# Patient Record
Sex: Female | Born: 1995 | Race: Black or African American | Hispanic: No | Marital: Single | Smoking: Never smoker
Health system: Southern US, Community
[De-identification: ages and names within clinical notes are randomized; demographics above are authoritative.]

## PROBLEM LIST (undated history)

## (undated) DIAGNOSIS — L309 Dermatitis, unspecified: Secondary | ICD-10-CM

## (undated) DIAGNOSIS — J45909 Unspecified asthma, uncomplicated: Secondary | ICD-10-CM

## (undated) DIAGNOSIS — T7840XA Allergy, unspecified, initial encounter: Secondary | ICD-10-CM

## (undated) DIAGNOSIS — Z889 Allergy status to unspecified drugs, medicaments and biological substances status: Secondary | ICD-10-CM

## (undated) DIAGNOSIS — Z91018 Allergy to other foods: Secondary | ICD-10-CM

## (undated) HISTORY — PX: WISDOM TOOTH EXTRACTION: SHX21

## (undated) HISTORY — DX: Allergy, unspecified, initial encounter: T78.40XA

---
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
## 2014-01-28 ENCOUNTER — Emergency Department (HOSPITAL_COMMUNITY)
Admission: EM | Admit: 2014-01-28 | Discharge: 2014-01-28 | Disposition: A | Discharge status: Home / Self Care | Payer: 59 | Attending: Emergency Medicine | Admitting: Emergency Medicine

## 2014-01-28 ENCOUNTER — Emergency Department (HOSPITAL_COMMUNITY): Payer: 59

## 2014-01-28 ENCOUNTER — Encounter (HOSPITAL_COMMUNITY): Payer: Self-pay | Admitting: Emergency Medicine

## 2014-01-28 DIAGNOSIS — Y9389 Activity, other specified: Secondary | ICD-10-CM | POA: Insufficient documentation

## 2014-01-28 DIAGNOSIS — S99929A Unspecified injury of unspecified foot, initial encounter: Principal | ICD-10-CM

## 2014-01-28 DIAGNOSIS — Z79899 Other long term (current) drug therapy: Secondary | ICD-10-CM | POA: Insufficient documentation

## 2014-01-28 DIAGNOSIS — M25561 Pain in right knee: Secondary | ICD-10-CM

## 2014-01-28 DIAGNOSIS — Y92838 Other recreation area as the place of occurrence of the external cause: Secondary | ICD-10-CM

## 2014-01-28 DIAGNOSIS — J45909 Unspecified asthma, uncomplicated: Secondary | ICD-10-CM | POA: Insufficient documentation

## 2014-01-28 DIAGNOSIS — S99919A Unspecified injury of unspecified ankle, initial encounter: Principal | ICD-10-CM

## 2014-01-28 DIAGNOSIS — S8990XA Unspecified injury of unspecified lower leg, initial encounter: Secondary | ICD-10-CM | POA: Insufficient documentation

## 2014-01-28 DIAGNOSIS — Y9239 Other specified sports and athletic area as the place of occurrence of the external cause: Secondary | ICD-10-CM | POA: Insufficient documentation

## 2014-01-28 DIAGNOSIS — IMO0002 Reserved for concepts with insufficient information to code with codable children: Secondary | ICD-10-CM | POA: Insufficient documentation

## 2014-01-28 DIAGNOSIS — Z872 Personal history of diseases of the skin and subcutaneous tissue: Secondary | ICD-10-CM | POA: Insufficient documentation

## 2014-01-28 DIAGNOSIS — X500XXA Overexertion from strenuous movement or load, initial encounter: Secondary | ICD-10-CM | POA: Insufficient documentation

## 2014-01-28 MED ORDER — IBUPROFEN 400 MG PO TABS
800.0000 mg | ORAL_TABLET | Freq: Once | ORAL | Status: AC
  Administered 2014-01-28: 800 mg via ORAL
  Filled 2014-01-28: qty 2

## 2014-01-28 NOTE — Discharge Instructions (Signed)
Use RICE method to help with symptoms - see below  Return to the emergency department if you develop any changing/worsening condition, knee swelling/redness, fever, leg swelling, or any other concerns (please read additional information regarding your condition below)    Knee Pain Knee pain can be a result of an injury or other medical conditions. Treatment will depend on the cause of your pain. HOME CARE  Only take medicine as told by your doctor.  Keep a healthy weight. Being overweight can make the knee hurt more.  Stretch before exercising or playing sports.  If there is constant knee pain, change the way you exercise. Ask your doctor for advice.  Make sure shoes fit well. Choose the right shoe for the sport or activity.  Protect your knees. Wear kneepads if needed.  Rest when you are tired. GET HELP RIGHT AWAY IF:   Your knee pain does not stop.  Your knee pain does not get better.  Your knee joint feels hot to the touch.  You have a fever. MAKE SURE YOU:   Understand these instructions.  Will watch this condition.  Will get help right away if you are not doing well or get worse. Document Released: 01/16/2009 Document Revised: 01/12/2012 Document Reviewed: 01/16/2009 [facility] Patient Information 2014 [place], [place].  RICE: Routine Care for Injuries The routine care of many injuries includes Rest, Ice, Compression, and Elevation (RICE). HOME CARE INSTRUCTIONS  Rest is needed to allow your body to heal. Routine activities can usually be resumed when comfortable. Injured tendons and bones can take up to 6 weeks to heal. Tendons are the cord-like structures that attach muscle to bone.  Ice following an injury helps keep the swelling down and reduces pain.  Put ice in a plastic bag.  Place a towel between your skin and the bag.  Leave the ice on for 15-20 minutes, 03-04 times a day. Do this while awake, for the first 24 to 48 hours. After that, continue as  directed by your caregiver.  Compression helps keep swelling down. It also gives support and helps with discomfort. If an elastic bandage has been applied, it should be removed and reapplied every 3 to 4 hours. It should not be applied tightly, but firmly enough to keep swelling down. Watch fingers or toes for swelling, bluish discoloration, coldness, numbness, or excessive pain. If any of these problems occur, remove the bandage and reapply loosely. Contact your caregiver if these problems continue.  Elevation helps reduce swelling and decreases pain. With extremities, such as the arms, hands, legs, and feet, the injured area should be placed near or above the level of the heart, if possible. SEEK IMMEDIATE MEDICAL CARE IF:  You have persistent pain and swelling.  You develop redness, numbness, or unexpected weakness.  Your symptoms are getting worse rather than improving after several days. These symptoms may indicate that further evaluation or further X-rays are needed. Sometimes, X-rays may not show a small broken bone (fracture) until 1 week or 10 days later. Make a follow-up appointment with your caregiver. Ask when your X-ray results will be ready. Make sure you get your X-ray results. Document Released: 02/01/2001 Document Revised: 01/12/2012 Document Reviewed: 03/21/2011 [facility] Patient Information 2014 [place]ExitCare, MarylandLLC.

## 2014-01-28 NOTE — ED Notes (Signed)
Rt. Knee and back pain. "feels like its pulling."  Took ibuprofen with some relief.

## 2014-01-28 NOTE — ED Provider Notes (Signed)
CSN: [id number]     Arrival date & time 01/28/14  1910 History  This chart was scribed for non-physician practitioner, Coral CeoJessica Skylen Danielsen, PA-C working with Nelia Shiobert L Beaton, MD by Greggory StallionKayla Andersen, ED scribe. This patient was seen in room TR04C/TR04C and the patient's care was started at 8:58 PM.   Chief Complaint  Patient presents with  . Knee Pain  . Back Pain   The history is provided by the patient. No language interpreter was used.   HPI Comments: Oval LinseyChristian A Tarpley is a 18 y.o. female with a PMH of allergies and eczema who presents to the Emergency Department complaining of sudden onset, constant, sharp right knee pain that started 2 days ago while she was at ballet. She states she felt a pull in her knee but denies any injury or twisting. No other injuries or complaints. Pain is worsened with movement. Pain is located in her right knee with intermittent radiation down her right anterior leg. No calf pain or swelling. She has taken ibuprofen with some relief. Denies fever, nausea, emesis, back pain, numbness or tingling, weakness or loss of sensation. Pt has twisted her right knee in the past.    Past Medical History  Diagnosis Date  . Asthma   . Eczema   . Multiple allergies   . Multiple food allergies   . Allergy    History reviewed. No pertinent past surgical history. Family History  Problem Relation Age of Onset  . Epilepsy Father   . Diabetes Maternal Grandmother   . Hypertension Maternal Grandmother   . Hyperlipidemia Maternal Grandmother   . Heart disease Maternal Grandmother   . Cancer Maternal Grandmother   . Diabetes Maternal Grandfather   . Hypertension Maternal Grandfather   . Hyperlipidemia Maternal Grandfather   . Heart disease Maternal Grandfather   . Alzheimer's disease Paternal Grandmother   . Diabetes Paternal Grandfather   . Hypertension Paternal Grandfather    History  Substance Use Topics  . Smoking status: Never Smoker   . Smokeless tobacco: Not on file   . Alcohol Use: No   OB History   Grav Para Term Preterm Abortions TAB SAB Ect Mult Living                 Review of Systems  Constitutional: Negative for fever.  Cardiovascular: Negative for leg swelling.  Gastrointestinal: Negative for nausea, vomiting and abdominal pain.  Musculoskeletal: Positive for arthralgias (right knee). Negative for back pain and myalgias.  Skin: Negative for color change and wound.  Neurological: Negative for weakness and numbness.  All other systems reviewed and are negative.   Allergies  Other; Tree extract; and Tobacco  Home Medications   Current Outpatient Rx  Name  Route  Sig  Dispense  Refill  . albuterol (PROVENTIL HFA;VENTOLIN HFA) 108 (90 BASE) MCG/ACT inhaler   Inhalation   Inhale 2 puffs into the lungs every 6 (six) hours as needed. Wheezing or shortness of breath due to exertion         . albuterol (PROVENTIL) (2.5 MG/3ML) 0.083% nebulizer solution   Nebulization   Take 2.5 mg by nebulization every 6 (six) hours as needed. Wheezing or shortness of breath due to exertion         . EPINEPHrine (EPIPEN JR) 0.15 MG/0.3ML injection   Intramuscular   Inject 0.15 mg into the muscle as needed.         . fexofenadine-pseudoephedrine (ALLEGRA-D) 60-120 MG per tablet   Oral   Take  1 tablet by mouth 2 (two) times daily.         . ondansetron (ZOFRAN ODT) 4 MG disintegrating tablet   Oral   Take 1 tablet (4 mg total) by mouth every 8 (eight) hours as needed for nausea.   8 tablet   0   . silver sulfADIAZINE (SILVADENE) 1 % cream   Topical   Apply topically daily.   50 g   0    BP 116/80  Pulse 77  Temp(Src) 98.2 F (36.8 C) (Oral)  Resp 16  Ht 5' (1.524 m)  Wt 130 lb 3 oz (59.053 kg)  BMI 25.43 kg/m2  SpO2 100%  LMP 12/31/2013  Filed Vitals:   01/28/14 1915 01/28/14 2214  BP: 116/80 124/74  Pulse: 77 65  Temp: 98.2 F (36.8 C) 98.4 F (36.9 C)  TempSrc: Oral Oral  Resp: 16 16  Height: 5' (1.524 m)   Weight:  130 lb 3 oz (59.053 kg)   SpO2: 100% 95%    Physical Exam  Nursing note and vitals reviewed. Constitutional: She is oriented to person, place, and time. She appears well-developed and well-nourished. No distress.  HENT:  Head: Normocephalic and atraumatic.  Eyes: EOM are normal.  Neck: Neck supple. No tracheal deviation present.  Cardiovascular: Normal rate.   Dorsalis pedis pulses present and equal bilaterally  Pulmonary/Chest: Effort normal. No respiratory distress.  Musculoskeletal: Normal range of motion.  Tenderness to palpation to the right knee diffusely. No knee edema or erythema. Pain worse with flexion and extension of the right knee. No ligament laxity to the right knee. No pain to the right hip, calf, ankle, or foot. No LE edema bilaterally. Patient able to ambulate without difficulty or ataxia.   Neurological: She is alert and oriented to person, place, and time.  Sensation intact in the LE   Skin: Skin is warm and dry. She is not diaphoretic.     No wounds, edema, erythema or ecchymosis to the LE throughout  Psychiatric: She has a normal mood and affect. Her behavior is normal.    ED Course  Procedures (including critical care time)  DIAGNOSTIC STUDIES: Oxygen Saturation is 100% on RA, normal by my interpretation.    COORDINATION OF CARE: 9:01 PM-Discussed treatment plan which includes xray and ibuprofen with pt at bedside and pt agreed to plan.   Labs Review Labs Reviewed - No data to display Imaging Review Dg Knee Complete 4 Views Right  01/28/2014   CLINICAL DATA:  Right knee pain, no known trauma  EXAM: RIGHT KNEE - COMPLETE 4+ VIEW  COMPARISON:  None.  FINDINGS: No fracture or dislocation is seen.  The joint spaces are preserved.  The visualized soft tissues are unremarkable.  No suprapatellar knee joint effusion.  IMPRESSION: Normal right knee radiographs.   Electronically Signed   By: Charline Bills M.D.   On: 01/28/2014 21:55     EKG  Interpretation None      MDM   ROSANGELICA PEVEHOUSE is a 18 y.o. female with a PMH of allergies and eczema who presents to the Emergency Department complaining of sudden onset, constant, sharp right knee pain that started 2 days ago while she was at ballet.  Rechecks  10:00 PM = Pain improved after Ibuprofen.    Etiology of knee pain possibly due to strain vs sprain vs ligamentous injury. X-rays negative for fracture or malalignment. No joint instability on exam. No signs of infection or joint effusion. Patient afebrile and  non-toxic in appearance. Patient neurovascularly intact. Given knee sleeve and instructed to refrain from dance for 1 week. RICE method discussed. Instructed to follow-up with PCP if symptoms not improving or worsening. Return precautions, discharge instructions, and follow-up was discussed with the patient before discharge.     Discharge Medication List as of 01/28/2014 10:09 PM       Final impressions: 1. Right knee pain       Thomasenia Sales   I personally performed the services described in this documentation, which was scribed in my presence. The recorded information has been reviewed and is accurate.   Jillyn Ledger, PA-C 01/30/14 1453

## 2014-02-04 NOTE — ED Provider Notes (Signed)
Medical screening examination/treatment/procedure(s) were performed by non-physician practitioner and as supervising physician I was immediately available for consultation/collaboration.   Nelia Shiobert L Zimir Kittleson, MD 02/04/14 0830

## 2014-04-10 ENCOUNTER — Encounter (HOSPITAL_COMMUNITY): Payer: Self-pay | Admitting: Emergency Medicine

## 2014-04-10 ENCOUNTER — Emergency Department (HOSPITAL_COMMUNITY)
Admission: EM | Admit: 2014-04-10 | Discharge: 2014-04-10 | Disposition: A | Discharge status: Home / Self Care | Payer: 59 | Attending: Emergency Medicine | Admitting: Emergency Medicine

## 2014-04-10 DIAGNOSIS — R112 Nausea with vomiting, unspecified: Secondary | ICD-10-CM | POA: Insufficient documentation

## 2014-04-10 DIAGNOSIS — J45909 Unspecified asthma, uncomplicated: Secondary | ICD-10-CM | POA: Insufficient documentation

## 2014-04-10 DIAGNOSIS — IMO0002 Reserved for concepts with insufficient information to code with codable children: Secondary | ICD-10-CM | POA: Insufficient documentation

## 2014-04-10 DIAGNOSIS — R197 Diarrhea, unspecified: Secondary | ICD-10-CM

## 2014-04-10 DIAGNOSIS — Z79899 Other long term (current) drug therapy: Secondary | ICD-10-CM | POA: Insufficient documentation

## 2014-04-10 DIAGNOSIS — Z3202 Encounter for pregnancy test, result negative: Secondary | ICD-10-CM | POA: Insufficient documentation

## 2014-04-10 DIAGNOSIS — R1084 Generalized abdominal pain: Secondary | ICD-10-CM | POA: Insufficient documentation

## 2014-04-10 DIAGNOSIS — R109 Unspecified abdominal pain: Secondary | ICD-10-CM

## 2014-04-10 DIAGNOSIS — Z872 Personal history of diseases of the skin and subcutaneous tissue: Secondary | ICD-10-CM | POA: Insufficient documentation

## 2014-04-10 DIAGNOSIS — Z791 Long term (current) use of non-steroidal anti-inflammatories (NSAID): Secondary | ICD-10-CM | POA: Insufficient documentation

## 2014-04-10 LAB — COMPREHENSIVE METABOLIC PANEL
ALBUMIN: 3.7 g/dL (ref 3.5–5.2)
ALK PHOS: 52 U/L (ref 39–117)
ALT: 15 U/L (ref 0–35)
AST: 30 U/L (ref 0–37)
BUN: 12 mg/dL (ref 6–23)
CALCIUM: 9.4 mg/dL (ref 8.4–10.5)
CO2: 24 mEq/L (ref 19–32)
CREATININE: 0.62 mg/dL (ref 0.50–1.10)
Chloride: 102 mEq/L (ref 96–112)
GFR calc Af Amer: >90 mL/min (ref >90)
GFR calc non Af Amer: >90 mL/min (ref >90)
Glucose, Bld: 163 mg/dL — ABNORMAL HIGH (ref 70–99)
POTASSIUM: 3.8 meq/L (ref 3.7–5.3)
Sodium: 138 mEq/L (ref 137–147)
Total Bilirubin: 0.3 mg/dL (ref 0.3–1.2)
Total Protein: 7.2 g/dL (ref 6.0–8.3)

## 2014-04-10 LAB — URINALYSIS, ROUTINE W REFLEX MICROSCOPIC
Bilirubin Urine: NEGATIVE
Glucose, UA: NEGATIVE mg/dL
Ketones, ur: NEGATIVE mg/dL
NITRITE: NEGATIVE
Protein, ur: NEGATIVE mg/dL
Specific Gravity, Urine: 1.026 (ref 1.005–1.030)
UROBILINOGEN UA: 1 mg/dL (ref 0.0–1.0)
pH: 6 (ref 5.0–8.0)

## 2014-04-10 LAB — CBC WITH DIFFERENTIAL/PLATELET
BASOS ABS: 0 10*3/uL (ref 0.0–0.1)
Basophils Relative: 0 % (ref 0–1)
EOS ABS: 0.1 10*3/uL (ref 0.0–0.7)
Eosinophils Relative: 1 % (ref 0–5)
HEMATOCRIT: 28 % — AB (ref 36.0–46.0)
Hemoglobin: 8.3 g/dL — ABNORMAL LOW (ref 12.0–15.0)
LYMPHS PCT: 31 % (ref 12–46)
Lymphs Abs: 2.8 10*3/uL (ref 0.7–4.0)
MCH: 18.1 pg — ABNORMAL LOW (ref 26.0–34.0)
MCHC: 29.6 g/dL — AB (ref 30.0–36.0)
MCV: 61 fL — ABNORMAL LOW (ref 78.0–100.0)
Monocytes Absolute: 0.3 10*3/uL (ref 0.1–1.0)
Monocytes Relative: 3 % (ref 3–12)
NEUTROS ABS: 5.8 10*3/uL (ref 1.7–7.7)
Neutrophils Relative %: 65 % (ref 43–77)
Platelets: 358 10*3/uL (ref 150–400)
RBC: 4.59 MIL/uL (ref 3.87–5.11)
RDW: 18.9 % — AB (ref 11.5–15.5)
WBC: 9 10*3/uL (ref 4.0–10.5)

## 2014-04-10 LAB — URINE MICROSCOPIC-ADD ON

## 2014-04-10 LAB — LIPASE, BLOOD: LIPASE: 40 U/L (ref 11–59)

## 2014-04-10 LAB — POC URINE PREG, ED: PREG TEST UR: NEGATIVE

## 2014-04-10 MED ORDER — SODIUM CHLORIDE 0.9 % IV BOLUS (SEPSIS)
1000.0000 mL | Freq: Once | INTRAVENOUS | Status: AC
  Administered 2014-04-10: 1000 mL via INTRAVENOUS

## 2014-04-10 MED ORDER — DICYCLOMINE HCL 20 MG PO TABS: 20.0000 mg | ORAL_TABLET | Freq: Two times a day (BID) | ORAL | Status: DC | PRN

## 2014-04-10 MED ORDER — MORPHINE SULFATE 4 MG/ML IJ SOLN
4.0000 mg | Freq: Once | INTRAMUSCULAR | Status: AC
  Administered 2014-04-10: 4 mg via INTRAVENOUS
  Filled 2014-04-10: qty 1

## 2014-04-10 MED ORDER — ONDANSETRON HCL 4 MG/2ML IJ SOLN
4.0000 mg | Freq: Once | INTRAMUSCULAR | Status: AC
  Administered 2014-04-10: 4 mg via INTRAVENOUS
  Filled 2014-04-10: qty 2

## 2014-04-10 MED ORDER — KETOROLAC TROMETHAMINE 30 MG/ML IJ SOLN
30.0000 mg | Freq: Once | INTRAMUSCULAR | Status: AC
  Administered 2014-04-10: 30 mg via INTRAVENOUS
  Filled 2014-04-10: qty 1

## 2014-04-10 MED ORDER — ONDANSETRON HCL 4 MG PO TABS: 4.0000 mg | ORAL_TABLET | Freq: Three times a day (TID) | ORAL | Status: DC | PRN

## 2014-04-10 NOTE — Discharge Instructions (Signed)
Read the information below.  Use the prescribed medication as directed.  Please discuss all new medications with your pharmacist.  You may return to the Emergency Department at any time for worsening condition or any new symptoms that concern you.  If you develop high fevers, worsening abdominal pain, uncontrolled vomiting, or are unable to tolerate fluids by mouth, return to the ER for a recheck.      Abdominal Pain, Adult Many things can cause abdominal pain. Usually, abdominal pain is not caused by a disease and will improve without treatment. It can often be observed and treated at home. Your health care provider will do a physical exam and possibly order blood tests and X-rays to help determine the seriousness of your pain. However, in many cases, more time must pass before a clear cause of the pain can be found. Before that point, your health care provider may not know if you need more testing or further treatment. HOME CARE INSTRUCTIONS  Monitor your abdominal pain for any changes. The following actions may help to alleviate any discomfort you are experiencing:  Only take over-the-counter or prescription medicines as directed by your health care provider.  Do not take laxatives unless directed to do so by your health care provider.  Try a clear liquid diet (broth, tea, or water) as directed by your health care provider. Slowly move to a bland diet as tolerated. SEEK MEDICAL CARE IF:  You have unexplained abdominal pain.  You have abdominal pain associated with nausea or diarrhea.  You have pain when you urinate or have a bowel movement.  You experience abdominal pain that wakes you in the night.  You have abdominal pain that is worsened or improved by eating food.  You have abdominal pain that is worsened with eating fatty foods. SEEK IMMEDIATE MEDICAL CARE IF:   Your pain does not go away within 2 hours.  You have a fever.  You keep throwing up (vomiting).  Your pain is  felt only in portions of the abdomen, such as the right side or the left lower portion of the abdomen.  You pass bloody or black tarry stools. MAKE SURE YOU:  Understand these instructions.   Will watch your condition.   Will get help right away if you are not doing well or get worse.  Document Released: 07/30/2005 Document Revised: 08/10/2013 Document Reviewed: 06/29/2013 [facility] Patient Information 2014 [place], [place].

## 2014-04-10 NOTE — ED Provider Notes (Signed)
Medical screening examination/treatment/procedure(s) were performed by non-physician practitioner and as supervising physician I was immediately available for consultation/collaboration.   EKG Interpretation None       Olivia Mackie, MD 04/10/14 [phone]

## 2014-04-10 NOTE — ED Provider Notes (Signed)
CSN: [id number]     Arrival date & time 04/10/14  0601 History   First MD Initiated Contact with Patient 04/10/14 [phone]     Chief Complaint  Patient presents with  . Abdominal Pain     (Consider location/radiation/quality/duration/timing/severity/associated sxs/prior Treatment) HPI  Patient was awoken from sleep overnight with abdominal pain, N/V/D.  The pain is described as "sharp and twisting" and is located throughout her abdomen, it is constant.  The diarrhea began prior to the vomiting.  Has had 2-3 episodes of each.  Emesis and diarrhea are nonbloody.  Denies fever, chills, cough, SOB, urinary symptoms.  Denies sick contacts.  Denies any new or different foods- she did have shrimp lo mein last night for dinner and mother had one of the shrimp, states she feels fine.  No hx abdominal surgeries.    Past Medical History  Diagnosis Date  . Asthma   . Eczema   . Multiple allergies   . Multiple food allergies   . Allergy    History reviewed. No pertinent past surgical history. Family History  Problem Relation Age of Onset  . Epilepsy Father   . Diabetes Maternal Grandmother   . Hypertension Maternal Grandmother   . Hyperlipidemia Maternal Grandmother   . Heart disease Maternal Grandmother   . Cancer Maternal Grandmother   . Diabetes Maternal Grandfather   . Hypertension Maternal Grandfather   . Hyperlipidemia Maternal Grandfather   . Heart disease Maternal Grandfather   . Alzheimer's disease Paternal Grandmother   . Diabetes Paternal Grandfather   . Hypertension Paternal Grandfather    History  Substance Use Topics  . Smoking status: Never Smoker   . Smokeless tobacco: Not on file  . Alcohol Use: No   OB History   Grav Para Term Preterm Abortions TAB SAB Ect Mult Living                 Review of Systems  All other systems reviewed and are negative.     Allergies  Other; Tobacco; and Tree extract  Home Medications   Prior to Admission medications    Medication Sig Start Date End Date Taking? Authorizing Provider  beclomethasone (QVAR) 40 MCG/ACT inhaler Inhale 1 puff into the lungs daily as needed (congestion due to weather changes/asthma).   Yes Historical Provider, MD  fexofenadine-pseudoephedrine (ALLEGRA-D) 60-120 MG per tablet Take 1 tablet by mouth 2 (two) times daily.   Yes Historical Provider, MD  fluticasone (FLONASE) 50 MCG/ACT nasal spray Place 1 spray into both nostrils daily as needed for allergies.  12/01/13  Yes Historical Provider, MD  ibuprofen (ADVIL,MOTRIN) 800 MG tablet Take 800 mg by mouth every 8 (eight) hours as needed for moderate pain.   Yes Historical Provider, MD  montelukast (SINGULAIR) 10 MG tablet Take 10 mg by mouth 2 (two) times daily.   Yes Historical Provider, MD  albuterol (PROVENTIL HFA;VENTOLIN HFA) 108 (90 BASE) MCG/ACT inhaler Inhale 2 puffs into the lungs 2 (two) times daily as needed for wheezing or shortness of breath (asthma). Wheezing or shortness of breath due to exertion    Historical Provider, MD  albuterol (PROVENTIL) (2.5 MG/3ML) 0.083% nebulizer solution Take 2.5 mg by nebulization 2 (two) times daily as needed for wheezing or shortness of breath (asthma). Wheezing or shortness of breath due to exertion    Historical Provider, MD  EPINEPHrine (EPIPEN JR) 0.15 MG/0.3ML injection Inject 0.15 mg into the muscle as needed for anaphylaxis.     Historical Provider, MD  BP 107/73  Pulse 96  Resp 16  Ht 5\' 3"  (1.6 m)  Wt 120 lb (54.432 kg)  BMI 21.26 kg/m2  SpO2 100% Physical Exam  Nursing note and vitals reviewed. Constitutional: She appears well-developed and well-nourished. No distress.  HENT:  Head: Normocephalic and atraumatic.  Neck: Neck supple.  Cardiovascular: Normal rate and regular rhythm.   Pulmonary/Chest: Effort normal and breath sounds normal. No respiratory distress. She has no wheezes. She has no rales.  Abdominal: Soft. She exhibits no distension. There is generalized  tenderness. There is no rigidity, no rebound and no guarding.  Neurological: She is alert.  Skin: She is not diaphoretic.    ED Course  Procedures (including critical care time) Labs Review Labs Reviewed  CBC WITH DIFFERENTIAL - Abnormal; Notable for the following:    Hemoglobin 8.3 (*)    HCT 28.0 (*)    MCV 61.0 (*)    MCH 18.1 (*)    MCHC 29.6 (*)    RDW 18.9 (*)    All other components within normal limits  COMPREHENSIVE METABOLIC PANEL - Abnormal; Notable for the following:    Glucose, Bld 163 (*)    All other components within normal limits  URINALYSIS, ROUTINE W REFLEX MICROSCOPIC - Abnormal; Notable for the following:    APPearance HAZY (*)    Hgb urine dipstick LARGE (*)    Leukocytes, UA MODERATE (*)    All other components within normal limits  URINE MICROSCOPIC-ADD ON - Abnormal; Notable for the following:    Bacteria, UA FEW (*)    All other components within normal limits  URINE CULTURE  LIPASE, BLOOD  POC URINE PREG, ED    Imaging Review No results found.   EKG Interpretation None      8:31 AM  Slightly delayed entry (see order time of morphine) Pt with temporary relief from toradol, now with returned pain.  Abdomen remains soft, nondistended, ttp diffusely, L>R, no guarding, no rebound.   9:43 AM Pt reports improvement of pain with morphine.  Abdominal exam remains benign, diffuse tenderness, no mostly in upper abdomen, no guarding, no rebound.    MDM   Final diagnoses:  Abdominal pain  Nausea vomiting and diarrhea    Afebrile nontoxic patient with diffuse abdominal pain, N/V/D x several hours.  Serial abdominal exams remain benign.  Labs show decreasing Hgb.  Family is aware of this and is following it with her PCP.  Labs otherwise unremarkable.  UA likely contaminated, sent for culture.  Pt without urinary symptoms.  D/C home with bentyl and zofran.  Encouraged PO fluids.  Discussed return precautions.   Discussed result, findings, treatment,  and follow up  with patient and her mother.  Pt given return precautions.  Pt verbalizes understanding and agrees with plan.           [place], PA-C 04/10/14 [phone]

## 2014-04-10 NOTE — ED Notes (Signed)
Mother states pt awoke this AM with lower abd pain.  Pt has taken a couple doses of ammodium AD without relief.  Pt has had three episodes of emesis this morning

## 2014-04-11 LAB — URINE CULTURE: Colony Count: 50000

## 2014-08-05 ENCOUNTER — Emergency Department (INDEPENDENT_AMBULATORY_CARE_PROVIDER_SITE_OTHER): Payer: 59

## 2014-08-05 ENCOUNTER — Emergency Department (HOSPITAL_COMMUNITY)
Admission: EM | Admit: 2014-08-05 | Discharge: 2014-08-05 | Disposition: A | Discharge status: Home / Self Care | Payer: 59 | Source: Home / Self Care | Attending: Emergency Medicine | Admitting: Emergency Medicine

## 2014-08-05 ENCOUNTER — Encounter (HOSPITAL_COMMUNITY): Payer: Self-pay | Admitting: Emergency Medicine

## 2014-08-05 DIAGNOSIS — S46011A Strain of muscle(s) and tendon(s) of the rotator cuff of right shoulder, initial encounter: Secondary | ICD-10-CM

## 2014-08-05 DIAGNOSIS — Y92410 Unspecified street and highway as the place of occurrence of the external cause: Secondary | ICD-10-CM

## 2014-08-05 DIAGNOSIS — S63501A Unspecified sprain of right wrist, initial encounter: Secondary | ICD-10-CM

## 2014-08-05 MED ORDER — HYDROCODONE-ACETAMINOPHEN 5-325 MG PO TABS: ORAL_TABLET | ORAL | Status: DC

## 2014-08-05 MED ORDER — HYDROCODONE-ACETAMINOPHEN 5-325 MG PO TABS
2.0000 | ORAL_TABLET | Freq: Once | ORAL | Status: AC
  Administered 2014-08-05: 2 via ORAL

## 2014-08-05 MED ORDER — HYDROCODONE-ACETAMINOPHEN 5-325 MG PO TABS
ORAL_TABLET | ORAL | Status: AC
  Filled 2014-08-05: qty 2
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
## 2014-08-05 NOTE — ED Notes (Signed)
Patient transported to X-ray 
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
## 2014-08-05 NOTE — Discharge Instructions (Signed)

## 2015-07-22 ENCOUNTER — Ambulatory Visit (INDEPENDENT_AMBULATORY_CARE_PROVIDER_SITE_OTHER): Payer: 59 | Admitting: Emergency Medicine

## 2015-07-22 VITALS — BP 124/64 | HR 86 | Temp 98.5°F | Resp 16 | Ht 63.0 in | Wt 122.0 lb

## 2015-07-22 DIAGNOSIS — R071 Chest pain on breathing: Secondary | ICD-10-CM | POA: Diagnosis not present

## 2015-07-22 DIAGNOSIS — M940 Chondrocostal junction syndrome [Tietze]: Secondary | ICD-10-CM

## 2015-07-22 MED ORDER — NAPROXEN SODIUM 550 MG PO TABS: 550.0000 mg | ORAL_TABLET | Freq: Two times a day (BID) | ORAL | Status: AC

## 2015-07-22 MED ORDER — METHYLPREDNISOLONE ACETATE 80 MG/ML IJ SUSP: 80.0000 mg | Freq: Once | INTRAMUSCULAR | Status: DC

## 2015-07-22 NOTE — Patient Instructions (Signed)
Costochondritis Costochondritis, sometimes called Tietze syndrome, is a swelling and irritation (inflammation) of the tissue (cartilage) that connects your ribs with your breastbone (sternum). It causes pain in the chest and rib area. Costochondritis usually goes away on its own over time. It can take up to 6 weeks or longer to get better, especially if you are unable to limit your activities. CAUSES  Some cases of costochondritis have no known cause. Possible causes include:  Injury (trauma).  Exercise or activity such as lifting.  Severe coughing. SIGNS AND SYMPTOMS  Pain and tenderness in the chest and rib area.  Pain that gets worse when coughing or taking deep breaths.  Pain that gets worse with specific movements. DIAGNOSIS  Your health care provider will do a physical exam and ask about your symptoms. Chest X-rays or other tests may be done to rule out other problems. TREATMENT  Costochondritis usually goes away on its own over time. Your health care provider may prescribe medicine to help relieve pain. HOME CARE INSTRUCTIONS   Avoid exhausting physical activity. Try not to strain your ribs during normal activity. This would include any activities using chest, abdominal, and side muscles, especially if heavy weights are used.  Apply ice to the affected area for the first 2 days after the pain begins.  Put ice in a plastic bag.  Place a towel between your skin and the bag.  Leave the ice on for 20 minutes, 2-3 times a day.  Only take over-the-counter or prescription medicines as directed by your health care provider. SEEK MEDICAL CARE IF:  You have redness or swelling at the rib joints. These are signs of infection.  Your pain does not go away despite rest or medicine. SEEK IMMEDIATE MEDICAL CARE IF:   Your pain increases or you are very uncomfortable.  You have shortness of breath or difficulty breathing.  You cough up blood.  You have worse chest pains,  sweating, or vomiting.  You have a fever or persistent symptoms for more than 2-3 days.  You have a fever and your symptoms suddenly get worse. MAKE SURE YOU:   Understand these instructions.  Will watch your condition.  Will get help right away if you are not doing well or get worse. Document Released: 07/30/2005 Document Revised: 08/10/2013 Document Reviewed: 05/24/2013 [employer] Patient Information 2015 [employer]. This information is not intended to replace advice given to you by your health care provider. Make sure you discuss any questions you have with your health care provider.  

## 2015-07-22 NOTE — Progress Notes (Signed)
Subjective:  Patient ID: Dominique Tucker, female    DOB: [birth year]  Age: 19 y.o. MRN: [id number]  CC: Chest Pain and Cough   HPI Dominique Tucker presents  patient has a week long history of pain in her left anterior chest. Worse with breathing bending sneezing or coughing. She has no history of injury or overuse. No antecedent illness. No fever or chills. No wheezing or shortness of breath. No calf or thigh pain and swelling. No history of immobilization anesthesia or travel. She has no improvement with over-the-counter medication. She is a Archivist  History Dominique Tucker has a past medical history of Asthma; Eczema; Multiple allergies; Multiple food allergies; and Allergy.   She has no past surgical history on file.   Her  family history includes Alzheimer's disease in her paternal grandmother; Cancer in her maternal grandmother; Diabetes in her maternal grandfather, maternal grandmother, and paternal grandfather; Epilepsy in her father; Heart disease in her maternal grandfather and maternal grandmother; Hyperlipidemia in her maternal grandfather and maternal grandmother; Hypertension in her maternal grandfather, maternal grandmother, and paternal grandfather.  She   reports that she has never smoked. She does not have any smokeless tobacco history on file. She reports that she does not drink alcohol or use illicit drugs.  Outpatient Prescriptions Prior to Visit  Medication Sig Dispense Refill  . albuterol (PROVENTIL HFA;VENTOLIN HFA) 108 (90 BASE) MCG/ACT inhaler Inhale 2 puffs into the lungs 2 (two) times daily as needed for wheezing or shortness of breath (asthma). Wheezing or shortness of breath due to exertion    . albuterol (PROVENTIL) (2.5 MG/3ML) 0.083% nebulizer solution Take 2.5 mg by nebulization 2 (two) times daily as needed for wheezing or shortness of breath (asthma). Wheezing or shortness of breath due to exertion    . beclomethasone (QVAR) 40 MCG/ACT inhaler  Inhale 1 puff into the lungs daily as needed (congestion due to weather changes/asthma).    . EPINEPHrine (EPIPEN JR) 0.15 MG/0.3ML injection Inject 0.15 mg into the muscle as needed for anaphylaxis.     . fexofenadine-pseudoephedrine (ALLEGRA-D) 60-120 MG per tablet Take 1 tablet by mouth 2 (two) times daily.    . fluticasone (FLONASE) 50 MCG/ACT nasal spray Place 1 spray into both nostrils daily as needed for allergies.     Marland Kitchen ibuprofen (ADVIL,MOTRIN) 800 MG tablet Take 800 mg by mouth every 8 (eight) hours as needed for moderate pain.    . montelukast (SINGULAIR) 10 MG tablet Take 10 mg by mouth 2 (two) times daily.    Marland Kitchen dicyclomine (BENTYL) 20 MG tablet Take 1 tablet (20 mg total) by mouth 2 (two) times daily as needed for spasms (abdominal pain). (Patient not taking: Reported on 07/22/2015) 15 tablet 0  . HYDROcodone-acetaminophen (NORCO/VICODIN) 5-325 MG per tablet 1 to 2 tabs every 4 to 6 hours as needed for pain. (Patient not taking: Reported on 07/22/2015) 20 tablet 0  . ondansetron (ZOFRAN) 4 MG tablet Take 1 tablet (4 mg total) by mouth every 8 (eight) hours as needed for nausea or vomiting. (Patient not taking: Reported on 07/22/2015) 12 tablet 0   No facility-administered medications prior to visit.    Social History   Social History  . Marital Status: Single    Spouse Name: N/A  . Number of Children: N/A  . Years of Education: N/A   Social History Main Topics  . Smoking status: Never Smoker   . Smokeless tobacco: None  . Alcohol Use: No  . Drug Use:  No  . Sexual Activity: Not Asked   Other Topics Concern  . None   Social History Narrative     Review of Systems  Constitutional: Negative for fever, chills and appetite change.  HENT: Negative for congestion, ear pain, postnasal drip, sinus pressure and sore throat.   Eyes: Negative for pain and redness.  Respiratory: Negative for cough, shortness of breath and wheezing.   Cardiovascular: Positive for chest pain.  Negative for leg swelling.  Gastrointestinal: Negative for nausea, vomiting, abdominal pain, diarrhea, constipation and blood in stool.  Endocrine: Negative for polyuria.  Genitourinary: Negative for dysuria, urgency, frequency and flank pain.  Musculoskeletal: Negative for gait problem.  Skin: Negative for rash.  Neurological: Negative for weakness and headaches.  Psychiatric/Behavioral: Negative for confusion and decreased concentration. The patient is not nervous/anxious.     Objective:  BP 124/64 mmHg  Pulse 86  Temp(Src) 98.5 F (36.9 C) (Oral)  Resp 16  Ht  (1.6 m)  Wt 122 lb (55.339 kg)  BMI 21.62 kg/m2  Physical Exam  Constitutional: She is oriented to person, place, and time. She appears well-developed and well-nourished. No distress.  HENT:  Head: Normocephalic and atraumatic.  Right Ear: External ear normal.  Left Ear: External ear normal.  Nose: Nose normal.  Eyes: Conjunctivae and EOM are normal. Pupils are equal, round, and reactive to light. No scleral icterus.  Neck: Normal range of motion. Neck supple. No tracheal deviation present.  Cardiovascular: Normal rate, regular rhythm and normal heart sounds.   Pulmonary/Chest: Effort normal. No respiratory distress. She has no wheezes. She has no rales. She exhibits tenderness (She has a marked local tenderness on sternal border at the fourth and fifth sternocostal margins. There is no crepitus.).  Abdominal: She exhibits no mass. There is no tenderness. There is no rebound and no guarding.  Musculoskeletal: She exhibits no edema.  Lymphadenopathy:    She has no cervical adenopathy.  Neurological: She is alert and oriented to person, place, and time. Coordination normal.  Skin: Skin is warm and dry. No rash noted.  Psychiatric: She has a normal mood and affect. Her behavior is normal.      Assessment & Plan:   Dominique Tucker was seen today for chest pain and cough.  Diagnoses and all orders for this  visit:  Costochondritis -     methylPREDNISolone acetate (DEPO-MEDROL) injection 80 mg; Inject 1 mL (80 mg total) into the muscle once.  Chest pain on breathing  Other orders -     naproxen sodium (ANAPROX DS) 550 MG tablet; Take 1 tablet (550 mg total) by mouth 2 (two) times daily with a meal.   I have discontinued Dominique Tucker's ondansetron, dicyclomine, and HYDROcodone-acetaminophen. I am also having her start on naproxen sodium. Additionally, I am having her maintain her EPINEPHrine, fexofenadine-pseudoephedrine, albuterol, albuterol, fluticasone, beclomethasone, montelukast, ibuprofen, and aspirin EC. We will continue to administer methylPREDNISolone acetate.  Meds ordered this encounter  Medications  . aspirin EC 325 MG tablet    Sig: Take 325 mg by mouth once.  . naproxen sodium (ANAPROX DS) 550 MG tablet    Sig: Take 1 tablet (550 mg total) by mouth 2 (two) times daily with a meal.    Dispense:  40 tablet    Refill:  0  . methylPREDNISolone acetate (DEPO-MEDROL) injection 80 mg    Sig:    She was offered non-steroidal and inflammatory is an expected treatment. She elected to undergo trigger point injections. She was  injected in each of 2 costochondral junctions with 2 mL of Marcaine with an 40 mg of Depo-Medrol. She tolerated that well on a relieved her pain  Appropriate red flag conditions were discussed with the patient as well as actions that should be taken.  Patient expressed his understanding.  Follow-up: Return if symptoms worsen or fail to improve.  Carmelina Dane, MD

## 2016-04-22 ENCOUNTER — Encounter (HOSPITAL_BASED_OUTPATIENT_CLINIC_OR_DEPARTMENT_OTHER): Payer: Self-pay | Admitting: Emergency Medicine

## 2016-04-22 ENCOUNTER — Emergency Department (HOSPITAL_BASED_OUTPATIENT_CLINIC_OR_DEPARTMENT_OTHER)
Admission: EM | Admit: 2016-04-22 | Discharge: 2016-04-22 | Disposition: A | Discharge status: Home / Self Care | Payer: 59 | Attending: Emergency Medicine | Admitting: Emergency Medicine

## 2016-04-22 DIAGNOSIS — J45909 Unspecified asthma, uncomplicated: Secondary | ICD-10-CM | POA: Insufficient documentation

## 2016-04-22 DIAGNOSIS — H9202 Otalgia, left ear: Secondary | ICD-10-CM | POA: Diagnosis present

## 2016-04-22 DIAGNOSIS — Z7982 Long term (current) use of aspirin: Secondary | ICD-10-CM | POA: Insufficient documentation

## 2016-04-22 DIAGNOSIS — H6002 Abscess of left external ear: Secondary | ICD-10-CM | POA: Diagnosis not present

## 2016-04-22 MED ORDER — CEPHALEXIN 500 MG PO CAPS: 500.0000 mg | ORAL_CAPSULE | Freq: Four times a day (QID) | ORAL | Status: DC

## 2016-04-22 MED ORDER — CEPHALEXIN 250 MG PO CAPS
500.0000 mg | ORAL_CAPSULE | Freq: Once | ORAL | Status: AC
  Administered 2016-04-22: 500 mg via ORAL
  Filled 2016-04-22: qty 2

## 2016-04-22 NOTE — Discharge Instructions (Signed)

## 2016-04-22 NOTE — ED Notes (Signed)
C/o left ear pain and swelling onset Saturday  Denies drainage,  States sounds sound muffled

## 2016-04-22 NOTE — ED Provider Notes (Signed)
By signing my name below, I, [facility], attest that this documentation has been prepared under the direction and in the presence of [employer], DO. Electronically Signed: Randell Patient, ED Scribe. 04/22/2016. 11:25 PM.  TIME SEEN: 11:24 PM  CHIEF COMPLAINT: Otalgia  HPI: Dominique Tucker is a 20 y.o. female with history of asthma, eczema who presents to the Emergency Department complaining of constant, mild left ear pain onset 3 days ago. Pt states that she feels as if the outside of her ear and the side of her face adjacent to the ear are swollen. Pain is worse with palpation. Denies fever, cough, vomiting, and diarrhea.  ROS: See HPI Constitutional: no fever  Eyes: no drainage  ENT: no runny nose   Cardiovascular:  no chest pain  Resp: no SOB  GI: no vomiting GU: no dysuria Integumentary: no rash  Allergy: no hives  Musculoskeletal: no leg swelling  Neurological: no slurred speech ROS otherwise negative  PAST MEDICAL HISTORY/PAST SURGICAL HISTORY:  Past Medical History  Diagnosis Date  . Asthma   . Eczema   . Multiple allergies   . Multiple food allergies   . Allergy     MEDICATIONS:  Prior to Admission medications   Medication Sig Start Date End Date Taking? Authorizing Provider  albuterol (PROVENTIL HFA;VENTOLIN HFA) 108 (90 BASE) MCG/ACT inhaler Inhale 2 puffs into the lungs 2 (two) times daily as needed for wheezing or shortness of breath (asthma). Wheezing or shortness of breath due to exertion    Historical Provider, MD  albuterol (PROVENTIL) (2.5 MG/3ML) 0.083% nebulizer solution Take 2.5 mg by nebulization 2 (two) times daily as needed for wheezing or shortness of breath (asthma). Wheezing or shortness of breath due to exertion    Historical Provider, MD  aspirin EC 325 MG tablet Take 325 mg by mouth once.    Historical Provider, MD  beclomethasone (QVAR) 40 MCG/ACT inhaler Inhale 1 puff into the lungs daily as needed (congestion due to weather  changes/asthma).    Historical Provider, MD  EPINEPHrine (EPIPEN JR) 0.15 MG/0.3ML injection Inject 0.15 mg into the muscle as needed for anaphylaxis.     Historical Provider, MD  fexofenadine-pseudoephedrine (ALLEGRA-D) 60-120 MG per tablet Take 1 tablet by mouth 2 (two) times daily.    Historical Provider, MD  fluticasone (FLONASE) 50 MCG/ACT nasal spray Place 1 spray into both nostrils daily as needed for allergies.  12/01/13   Historical Provider, MD  ibuprofen (ADVIL,MOTRIN) 800 MG tablet Take 800 mg by mouth every 8 (eight) hours as needed for moderate pain.    Historical Provider, MD  montelukast (SINGULAIR) 10 MG tablet Take 10 mg by mouth 2 (two) times daily.    Historical Provider, MD  naproxen sodium (ANAPROX DS) 550 MG tablet Take 1 tablet (550 mg total) by mouth 2 (two) times daily with a meal. 07/22/15 07/21/16  Carmelina Dane, MD    ALLERGIES:  Allergies  Allergen Reactions  . Other Hives and Swelling    Oranges, kiwi, pineapple, oregano Cat and dog dander  . Tobacco [Nicotiana Tabacum] Rash    Tobacco leaves  . Tree Extract Rash    Pine, oak and maple    SOCIAL HISTORY:  Social History  Substance Use Topics  . Smoking status: Never Smoker   . Smokeless tobacco: Not on file  . Alcohol Use: No    FAMILY HISTORY: Family History  Problem Relation Age of Onset  . Epilepsy Father   . Diabetes Maternal  Grandmother   . Hypertension Maternal Grandmother   . Hyperlipidemia Maternal Grandmother   . Heart disease Maternal Grandmother   . Cancer Maternal Grandmother   . Diabetes Maternal Grandfather   . Hypertension Maternal Grandfather   . Hyperlipidemia Maternal Grandfather   . Heart disease Maternal Grandfather   . Alzheimer's disease Paternal Grandmother   . Diabetes Paternal Grandfather   . Hypertension Paternal Grandfather     EXAM: BP 110/79 mmHg  Pulse 90  Temp(Src) 98.7 F (37.1 C) (Oral)  Resp 18  Ht 5\' 1"  (1.549 m)  Wt 124 lb (56.246 kg)  BMI  23.44 kg/m2  SpO2 100%  LMP 04/08/2016 CONSTITUTIONAL: Alert and oriented and responds appropriately to questions. Well-appearing; well-nourished HEAD: Normocephalic EYES: Conjunctivae clear, PERRL ENT: normal nose; no rhinorrhea; moist mucous membranes; TMs are clear bilaterally without erythema, purulence, bulging, perforation, effusion.  No cerumen impaction or sign of foreign body in the external auditory canal. No inflammation, erythema or drainage from the external auditory canal. No signs of mastoiditis. No pain with manipulation of the pinna bilaterally.  Patient does have left-sided swelling to the preauricular area is tender to palpation here with mild warmth but no erythema. There is a small bump noted at the opening of the external auditory canal on the left. No drainage appreciated. No sign of otitis externa, mastoiditis. NECK: Supple, no meningismus, no LAD  CARD: RRR; S1 and S2 appreciated; no murmurs, no clicks, no rubs, no gallops RESP: Normal chest excursion without splinting or tachypnea; breath sounds clear and equal bilaterally; no wheezes, no rhonchi, no rales, no hypoxia or respiratory distress, speaking full sentences ABD/GI: Normal bowel sounds; non-distended; soft, non-tender, no rebound, no guarding, no peritoneal signs BACK:  The back appears normal and is non-tender to palpation, there is no CVA tenderness EXT: Normal ROM in all joints; non-tender to palpation; no edema; normal capillary refill; no cyanosis, no calf tenderness or swelling    SKIN: Normal color for age and race; warm; no rash NEURO: Moves all extremities equally, sensation to light touch intact diffusely, cranial nerves II through XII intact PSYCH: The patient's mood and manner are appropriate. Grooming and personal hygiene are appropriate.  MEDICAL DECISION MAKING: Patient here with what appears to be a small pimple, abscess inside the opening of the left external auditory canal. It is not large enough  at this time to drain. We'll place her on Keflex. No otitis media, otitis externa, mastoiditis. Recommend alternating Tylenol and Motrin. Discussed return precautions. She verbalized understanding and is comfortable with this plan.  I personally performed the services described in this documentation, which was scribed in my presence. The recorded information has been reviewed and is accurate.    Layla MawKristen N Ward, DO 04/22/16 2334

## 2016-04-22 NOTE — ED Notes (Signed)
Patient states that she is having pain to her left ear since Saturday - reports a fullness

## 2017-08-27 ENCOUNTER — Emergency Department (HOSPITAL_BASED_OUTPATIENT_CLINIC_OR_DEPARTMENT_OTHER)
Admission: EM | Admit: 2017-08-27 | Discharge: 2017-08-27 | Disposition: A | Discharge status: Home / Self Care | Payer: 59 | Attending: Emergency Medicine | Admitting: Emergency Medicine

## 2017-08-27 ENCOUNTER — Encounter (HOSPITAL_BASED_OUTPATIENT_CLINIC_OR_DEPARTMENT_OTHER): Payer: Self-pay | Admitting: Respiratory Therapy

## 2017-08-27 DIAGNOSIS — J9801 Acute bronchospasm: Secondary | ICD-10-CM | POA: Diagnosis not present

## 2017-08-27 DIAGNOSIS — Z7982 Long term (current) use of aspirin: Secondary | ICD-10-CM | POA: Insufficient documentation

## 2017-08-27 DIAGNOSIS — Z79899 Other long term (current) drug therapy: Secondary | ICD-10-CM | POA: Insufficient documentation

## 2017-08-27 DIAGNOSIS — Z7729 Contact with and (suspected ) exposure to other hazardous substances: Secondary | ICD-10-CM | POA: Diagnosis not present

## 2017-08-27 DIAGNOSIS — R0602 Shortness of breath: Secondary | ICD-10-CM | POA: Diagnosis present

## 2017-08-27 MED ORDER — ALBUTEROL SULFATE (2.5 MG/3ML) 0.083% IN NEBU: 2.5000 mg | INHALATION_SOLUTION | RESPIRATORY_TRACT | 0 refills | Status: AC | PRN

## 2017-08-27 MED ORDER — ONDANSETRON HCL 4 MG/2ML IJ SOLN
4.0000 mg | Freq: Once | INTRAMUSCULAR | Status: AC
  Administered 2017-08-27: 4 mg via INTRAVENOUS
  Filled 2017-08-27: qty 2

## 2017-08-27 MED ORDER — ONDANSETRON 4 MG PO TBDP: 4.0000 mg | ORAL_TABLET | Freq: Three times a day (TID) | ORAL | 0 refills | Status: DC | PRN

## 2017-08-27 NOTE — ED Notes (Signed)
Patient c/o sharp pain to her mid chest and stated that she had a hard time catching her breath.
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
## 2017-08-27 NOTE — ED Notes (Signed)
Patient took breathing treatment prior to admission.  Med is from her brother.  Albuterol nebulizer run out for a year.

## 2017-08-28 ENCOUNTER — Emergency Department (HOSPITAL_BASED_OUTPATIENT_CLINIC_OR_DEPARTMENT_OTHER)
Admission: EM | Admit: 2017-08-28 | Discharge: 2017-08-28 | Disposition: A | Discharge status: Home / Self Care | Payer: 59 | Attending: Emergency Medicine | Admitting: Emergency Medicine

## 2017-08-28 ENCOUNTER — Encounter (HOSPITAL_BASED_OUTPATIENT_CLINIC_OR_DEPARTMENT_OTHER): Payer: Self-pay

## 2017-08-28 DIAGNOSIS — Z7982 Long term (current) use of aspirin: Secondary | ICD-10-CM | POA: Insufficient documentation

## 2017-08-28 DIAGNOSIS — R1030 Lower abdominal pain, unspecified: Secondary | ICD-10-CM | POA: Diagnosis present

## 2017-08-28 DIAGNOSIS — R1012 Left upper quadrant pain: Secondary | ICD-10-CM | POA: Diagnosis not present

## 2017-08-28 DIAGNOSIS — Z79899 Other long term (current) drug therapy: Secondary | ICD-10-CM | POA: Insufficient documentation

## 2017-08-28 DIAGNOSIS — J45909 Unspecified asthma, uncomplicated: Secondary | ICD-10-CM | POA: Diagnosis not present

## 2017-08-28 LAB — CBC WITH DIFFERENTIAL/PLATELET
Basophils Absolute: 0 10*3/uL (ref 0.0–0.1)
Basophils Relative: 1 %
EOS ABS: 0.6 10*3/uL (ref 0.0–0.7)
Eosinophils Relative: 8 %
HCT: 40.8 % (ref 36.0–46.0)
HEMOGLOBIN: 13.3 g/dL (ref 12.0–15.0)
LYMPHS ABS: 2.7 10*3/uL (ref 0.7–4.0)
Lymphocytes Relative: 38 %
MCH: 26.8 pg (ref 26.0–34.0)
MCHC: 32.6 g/dL (ref 30.0–36.0)
MCV: 82.3 fL (ref 78.0–100.0)
Monocytes Absolute: 0.3 10*3/uL (ref 0.1–1.0)
Monocytes Relative: 4 %
Neutro Abs: 3.5 10*3/uL (ref 1.7–7.7)
Neutrophils Relative %: 49 %
Platelets: 291 10*3/uL (ref 150–400)
RBC: 4.96 MIL/uL (ref 3.87–5.11)
RDW: 14.5 % (ref 11.5–15.5)
WBC: 7.1 10*3/uL (ref 4.0–10.5)

## 2017-08-28 LAB — COMPREHENSIVE METABOLIC PANEL
ALBUMIN: 4.8 g/dL (ref 3.5–5.0)
ALK PHOS: 52 U/L (ref 38–126)
ALT: 11 U/L — AB (ref 14–54)
AST: 16 U/L (ref 15–41)
Anion gap: 5 (ref 5–15)
BUN: 11 mg/dL (ref 6–20)
CALCIUM: 9.4 mg/dL (ref 8.9–10.3)
CO2: 24 mmol/L (ref 22–32)
CREATININE: 0.68 mg/dL (ref 0.44–1.00)
Chloride: 108 mmol/L (ref 101–111)
GFR calc non Af Amer: >60 mL/min (ref >60)
GLUCOSE: 80 mg/dL (ref 65–99)
Potassium: 3.9 mmol/L (ref 3.5–5.1)
SODIUM: 137 mmol/L (ref 135–145)
Total Bilirubin: 0.8 mg/dL (ref 0.3–1.2)
Total Protein: 8.3 g/dL — ABNORMAL HIGH (ref 6.5–8.1)

## 2017-08-28 LAB — URINALYSIS, ROUTINE W REFLEX MICROSCOPIC
Bilirubin Urine: NEGATIVE
Glucose, UA: NEGATIVE mg/dL
Hgb urine dipstick: NEGATIVE
KETONES UR: NEGATIVE mg/dL
Leukocytes, UA: NEGATIVE
NITRITE: NEGATIVE
PH: 8 (ref 5.0–8.0)
Protein, ur: NEGATIVE mg/dL
SPECIFIC GRAVITY, URINE: 1.015 (ref 1.005–1.030)

## 2017-08-28 LAB — PREGNANCY, URINE: Preg Test, Ur: NEGATIVE

## 2017-08-28 LAB — LIPASE, BLOOD: Lipase: 33 U/L (ref 11–51)

## 2017-08-28 MED ORDER — DICYCLOMINE HCL 20 MG PO TABS: 20.0000 mg | ORAL_TABLET | Freq: Two times a day (BID) | ORAL | 0 refills | Status: DC

## 2017-08-28 NOTE — ED Provider Notes (Signed)
[facility] EMERGENCY DEPARTMENT Provider Note   CSN: [id number] Arrival date & time: 08/28/17  1613     History   Chief Complaint Chief Complaint  Patient presents with  . Abdominal Pain    HPI Dominique Tucker is a 21 y.o. female.  Patient with no past surgical history, history of asthma --presents with intermittent migrating abdominal pain starting early this morning.  Patient states that she was seen in the emergency department yesterday for an asthma attack.  Her breathing is improved.  The pain is cramping in nature.  This morning it was in her lower abdomen and then on the right side of her abdomen.  Prior to arrival, pain was left upper quadrant.  She has had associated nausea but no vomiting.  No fevers or diarrhea.  No urinary symptoms including dysuria, hematuria, increased urgency or frequency.  No treatments prior to arrival.  Patient denies heavy NSAID use, alcohol use.  No history of peptic ulcer disease.  Pain radiates into her back at times.  Onset of symptoms acute.  Nothing makes symptoms better or worse.      Past Medical History:  Diagnosis Date  . Allergy   . Asthma   . Eczema   . Multiple allergies   . Multiple food allergies     There are no active problems to display for this patient.   Past Surgical History:  Procedure Laterality Date  . WISDOM TOOTH EXTRACTION      OB History    No data available       Home Medications    Prior to Admission medications   Medication Sig Start Date End Date Taking? Authorizing Provider  albuterol (PROVENTIL HFA;VENTOLIN HFA) 108 (90 BASE) MCG/ACT inhaler Inhale 2 puffs into the lungs 2 (two) times daily as needed for wheezing or shortness of breath (asthma). Wheezing or shortness of breath due to exertion    [provider]  albuterol (PROVENTIL) (2.5 MG/3ML) 0.083% nebulizer solution Take 3-6 mLs (2.5-5 mg total) by nebulization every 4 (four) hours as needed for wheezing or  shortness of breath (asthma). Wheezing or shortness of breath due to exertion 08/27/17   Molpus, John, MD  aspirin EC 325 MG tablet Take 325 mg by mouth once.    [provider]  beclomethasone (QVAR) 40 MCG/ACT inhaler Inhale 1 puff into the lungs daily as needed (congestion due to weather changes/asthma).    [provider]  dicyclomine (BENTYL) 20 MG tablet Take 1 tablet (20 mg total) by mouth 2 (two) times daily. 08/28/17   Renne Crigler, PA-C  EPINEPHrine (EPIPEN JR) 0.15 MG/0.3ML injection Inject 0.15 mg into the muscle as needed for anaphylaxis.     [provider]  fexofenadine-pseudoephedrine (ALLEGRA-D) 60-120 MG per tablet Take 1 tablet by mouth 2 (two) times daily.    [provider]  fluticasone (FLONASE) 50 MCG/ACT nasal spray Place 1 spray into both nostrils daily as needed for allergies.  12/01/13   [provider]  ibuprofen (ADVIL,MOTRIN) 800 MG tablet Take 800 mg by mouth every 8 (eight) hours as needed for moderate pain.    [provider]  montelukast (SINGULAIR) 10 MG tablet Take 10 mg by mouth 2 (two) times daily.    [provider]  ondansetron (ZOFRAN ODT) 4 MG disintegrating tablet Take 1 tablet (4 mg total) by mouth every 8 (eight) hours as needed for nausea or vomiting. 08/27/17   Molpus, Jonny Ruiz, MD    Family History  Family History  Problem Relation Age of Onset  . Epilepsy Father   . Diabetes Maternal Grandmother   . Hypertension Maternal Grandmother   . Hyperlipidemia Maternal Grandmother   . Heart disease Maternal Grandmother   . Cancer Maternal Grandmother   . Diabetes Maternal Grandfather   . Hypertension Maternal Grandfather   . Hyperlipidemia Maternal Grandfather   . Heart disease Maternal Grandfather   . Alzheimer's disease Paternal Grandmother   . Diabetes Paternal Grandfather   . Hypertension Paternal Grandfather     Social History Social History  Substance Use Topics  . Smoking status:  Never Smoker  . Smokeless tobacco: Never Used  . Alcohol use No     Allergies   Other; Tobacco [nicotiana tabacum]; and Tree extract   Review of Systems Review of Systems  Constitutional: Negative for fever.  HENT: Negative for rhinorrhea and sore throat.   Eyes: Negative for redness.  Respiratory: Negative for cough.   Cardiovascular: Negative for chest pain.  Gastrointestinal: Positive for abdominal pain and nausea. Negative for diarrhea and vomiting.  Genitourinary: Negative for dysuria.  Musculoskeletal: Positive for back pain. Negative for myalgias.  Skin: Negative for rash.  Neurological: Negative for headaches.     Physical Exam Updated Vital Signs BP 107/76 (BP Location: Left Arm)   Pulse 96   Temp 98.4 F (36.9 C) (Oral)   Resp 18   Ht 5\' 1"  (1.549 m)   Wt 57.2 kg (126 lb)   SpO2 100%   BMI 23.81 kg/m   Physical Exam  Constitutional: She appears well-developed and well-nourished.  HENT:  Head: Normocephalic and atraumatic.  Eyes: Conjunctivae are normal. Right eye exhibits no discharge. Left eye exhibits no discharge.  Neck: Normal range of motion. Neck supple.  Cardiovascular: Normal rate, regular rhythm and normal heart sounds.   Pulmonary/Chest: Effort normal and breath sounds normal.  Abdominal: Soft. There is tenderness. There is no rebound and no guarding.  Mild left upper quadrant abdominal tenderness to palpation.  Remainder of abdomen is soft and nontender.  No rebound or guarding.  Musculoskeletal: She exhibits no edema.  No lower back or middle back tenderness to palpation.  Neurological: She is alert.  Skin: Skin is warm and dry.  Psychiatric: She has a normal mood and affect.  Nursing note and vitals reviewed.    ED Treatments / Results  Labs (all labs ordered are listed, but only abnormal results are displayed) Labs Reviewed  COMPREHENSIVE METABOLIC PANEL - Abnormal; Notable for the following:       Result Value   Total Protein 8.3  (*)    ALT 11 (*)    All other components within normal limits  URINALYSIS, ROUTINE W REFLEX MICROSCOPIC  PREGNANCY, URINE  CBC WITH DIFFERENTIAL/PLATELET  LIPASE, BLOOD    EKG  EKG Interpretation None       Radiology No results found.  Procedures Procedures (including critical care time)  Medications Ordered in ED Medications - No data to display   Initial Impression / Assessment and Plan / ED Course  I have reviewed the triage vital signs and the nursing notes.  Pertinent labs & imaging results that were available during my care of the patient were reviewed by me and considered in my medical decision making (see chart for details).     Patient seen and examined. Work-up initiated.  Low pretest probability of appendicitis or cholecystitis.  Will screen using blood work for pancreatitis and other interabdominal cause.  We will also  check UA given back pain.  Low concern for kidney stone.  Vital signs reviewed and are as follows: BP 107/76 (BP Location: Left Arm)   Pulse 96   Temp 98.4 F (36.9 C) (Oral)   Resp 18   Ht 5\' 1"  (1.549 m)   Wt 57.2 kg (126 lb)   SpO2 100%   BMI 23.81 kg/m   6:03 PM patient updated on results.  No indications for imaging at this time.  Patient is stable.  Abdomen is soft and nontender on exam.  Will discharge home with Bentyl.  The patient was urged to return to the Emergency Department immediately with worsening of current symptoms, worsening abdominal pain, persistent vomiting, blood noted in stools, fever, or any other concerns. The patient verbalized understanding.    Final Clinical Impressions(s) / ED Diagnoses   Final diagnoses:  Intermittent left upper quadrant abdominal pain   Patient with abdominal pain, grading, most recently in left upper quadrant. Vitals are stable, no fever. Labs reassuring, no pancreatitis. Imaging not felt indicated at this time. No signs of dehydration, patient is tolerating PO's. Lungs are clear and  no signs suggestive of PNA. Low concern for appendicitis, cholecystitis, pancreatitis, ruptured viscus, UTI, kidney stone, aortic dissection, aortic aneurysm or other emergent abdominal etiology. Supportive therapy indicated with return if symptoms worsen.    New Prescriptions New Prescriptions   DICYCLOMINE (BENTYL) 20 MG TABLET    Take 1 tablet (20 mg total) by mouth 2 (two) times daily.     Renne CriglerGeiple, Hobart Marte, PA-C 08/28/17 1803    Loren RacerYelverton, David, MD 08/30/17 [id number]

## 2017-08-28 NOTE — ED Triage Notes (Signed)
C/o abd pain, mid/lower back pain and nausea since 5am-NAD-steady gait

## 2017-08-28 NOTE — Discharge Instructions (Signed)
Please read and follow all provided instructions.  Your diagnoses today include:  1. Intermittent left upper quadrant abdominal pain     Tests performed today include:  Blood counts and electrolytes  Blood tests to check liver and kidney function  Blood tests to check pancreas function  Urine test to look for infection and pregnancy (in women)  Vital signs. See below for your results today.   Medications prescribed:   Bentyl - medication for intestinal cramps and spasms  Take any prescribed medications only as directed.  Home care instructions:   Follow any educational materials contained in this packet.  Follow-up instructions: Please follow-up with your primary care provider in the next 3 days for further evaluation of your symptoms if not getting better.    Return instructions:  SEEK IMMEDIATE MEDICAL ATTENTION IF:  The pain does not go away or becomes severe   A temperature above 101F develops   Repeated vomiting occurs (multiple episodes)   The pain becomes localized to portions of the abdomen. The right side could possibly be appendicitis. In an adult, the left lower portion of the abdomen could be colitis or diverticulitis.   Blood is being passed in stools or vomit (bright red or black tarry stools)   You develop chest pain, difficulty breathing, dizziness or fainting, or become confused, poorly responsive, or inconsolable (young children)  If you have any other emergent concerns regarding your health  Additional Information: Abdominal (belly) pain can be caused by many things. Your caregiver performed an examination and possibly ordered blood/urine tests and imaging (CT scan, x-rays, ultrasound). Many cases can be observed and treated at home after initial evaluation in the emergency department. Even though you are being discharged home, abdominal pain can be unpredictable. Therefore, you need a repeated exam if your pain does not resolve, returns, or  worsens. Most patients with abdominal pain don't have to be admitted to the hospital or have surgery, but serious problems like appendicitis and gallbladder attacks can start out as nonspecific pain. Many abdominal conditions cannot be diagnosed in one visit, so follow-up evaluations are very important.  Your vital signs today were: BP 107/76 (BP Location: Left Arm)    Pulse 96    Temp 98.4 F (36.9 C) (Oral)    Resp 18    Ht 5\' 1"  (1.549 m)    Wt 57.2 kg (126 lb)    SpO2 100%    BMI 23.81 kg/m  If your blood pressure (bp) was elevated above 135/85 this visit, please have this repeated by your doctor within one month. --------------

## 2017-09-01 ENCOUNTER — Ambulatory Visit (INDEPENDENT_AMBULATORY_CARE_PROVIDER_SITE_OTHER): Payer: 59 | Admitting: Pulmonary Disease

## 2017-09-01 ENCOUNTER — Ambulatory Visit (INDEPENDENT_AMBULATORY_CARE_PROVIDER_SITE_OTHER)
Admission: RE | Admit: 2017-09-01 | Discharge: 2017-09-01 | Disposition: A | Discharge status: Home / Self Care | Payer: 59 | Source: Ambulatory Visit | Attending: Pulmonary Disease | Admitting: Pulmonary Disease

## 2017-09-01 ENCOUNTER — Other Ambulatory Visit: Payer: 59

## 2017-09-01 ENCOUNTER — Encounter: Payer: Self-pay | Admitting: Pulmonary Disease

## 2017-09-01 VITALS — BP 112/62 | HR 104 | Ht 63.0 in | Wt 136.0 lb

## 2017-09-01 DIAGNOSIS — R0602 Shortness of breath: Secondary | ICD-10-CM

## 2017-09-01 DIAGNOSIS — J45909 Unspecified asthma, uncomplicated: Secondary | ICD-10-CM

## 2017-09-01 DIAGNOSIS — J4530 Mild persistent asthma, uncomplicated: Secondary | ICD-10-CM | POA: Diagnosis not present

## 2017-09-01 LAB — NITRIC OXIDE: Nitric Oxide: 113
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
## 2017-09-02 LAB — IGE: IGE (IMMUNOGLOBULIN E), SERUM: 170 kU/L — AB (ref <=114)

## 2017-09-03 ENCOUNTER — Telehealth: Payer: Self-pay | Admitting: Pulmonary Disease

## 2017-09-03 NOTE — Telephone Encounter (Signed)
Notes recorded by Lupita LeashMcQuaid, Douglas B, MD on 09/02/2017 at 9:09 AM EDT A, Please let the patient know this was OK Thanks, B  Advised pt of results. Pt understood and nothing further is needed.

## 2017-10-20 ENCOUNTER — Ambulatory Visit: Payer: 59 | Admitting: Pulmonary Disease

## 2018-03-24 IMAGING — DX DG CHEST 2V
2 series · 2 of 2 positions shown · non-contrast
Comparison: 12/31/2010 .

CLINICAL DATA: Shortness of breath.  Cough.  Nausea.  Dizziness.

EXAM:
CHEST  2 VIEW

[chest pa]
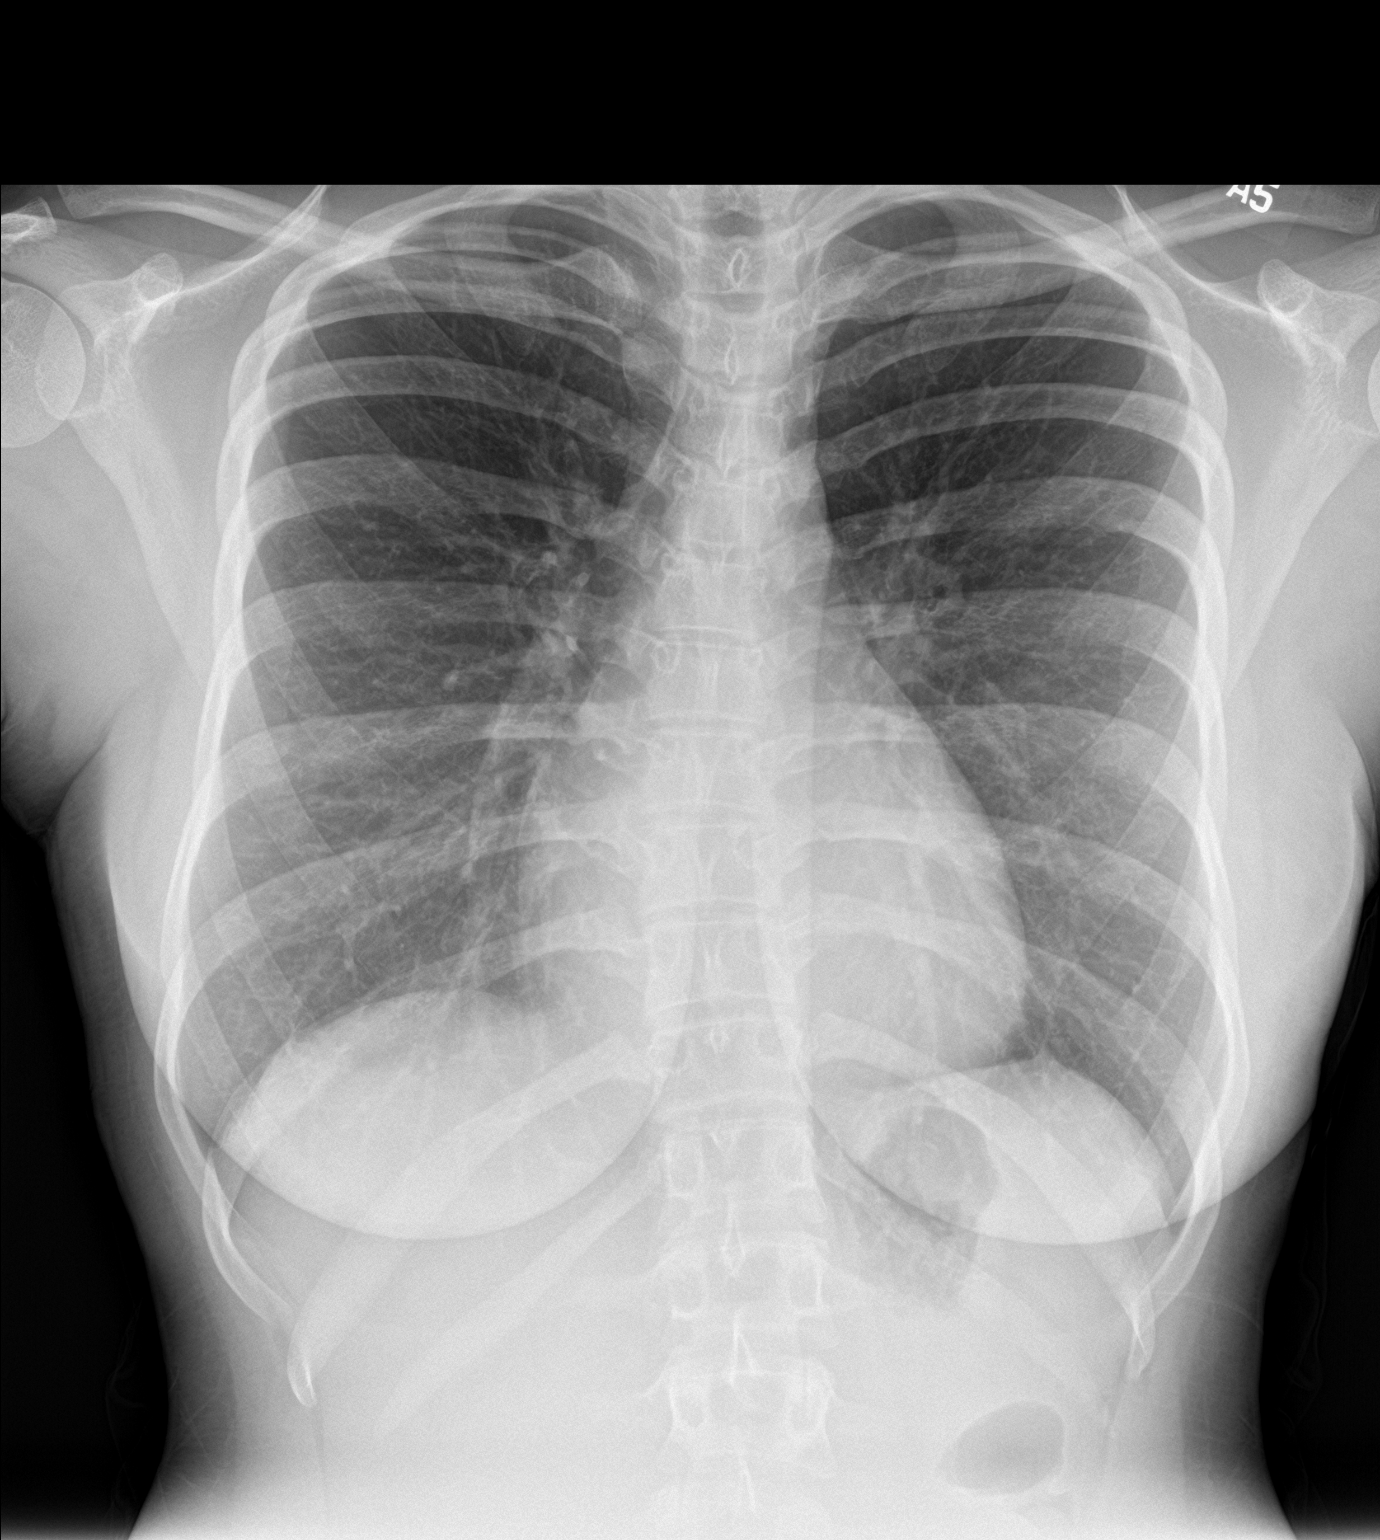

[chest lat]
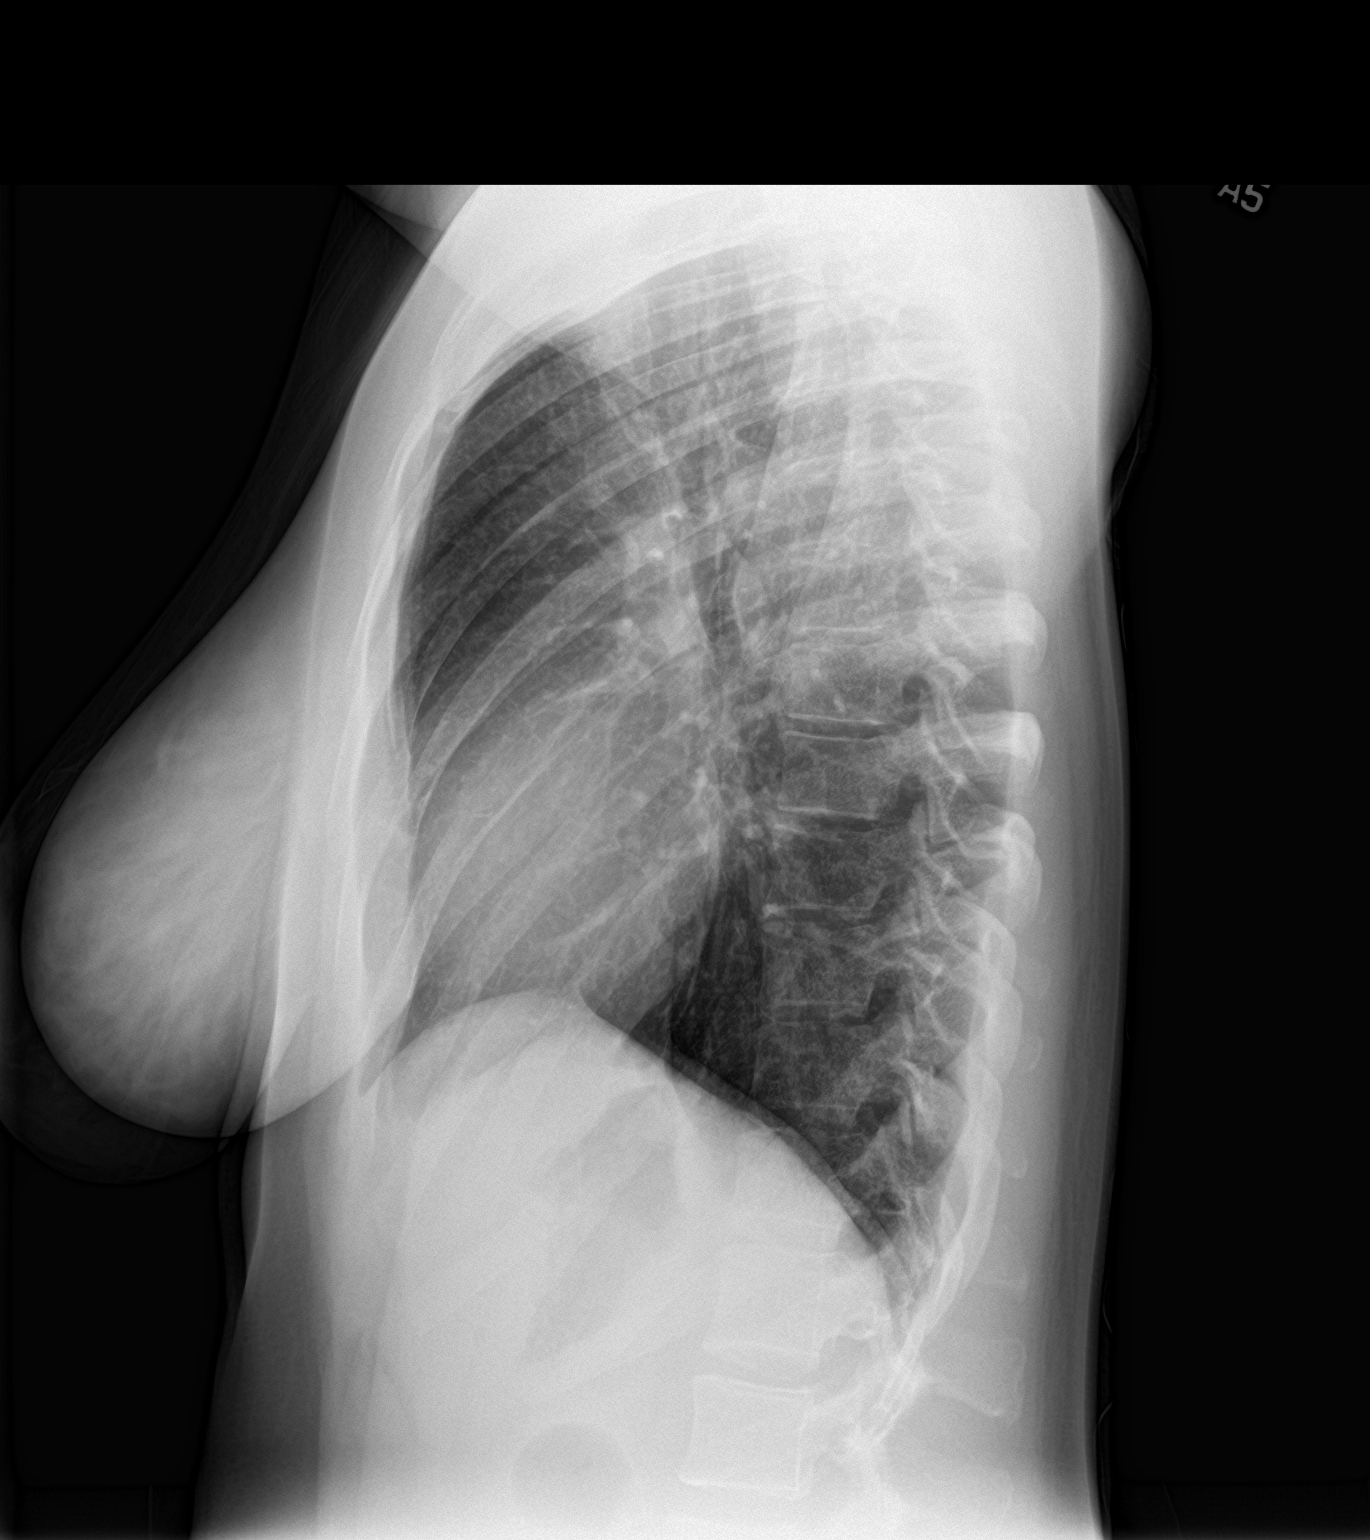

[2 of 2 positions shown; findings below may reference images not displayed]

FINDINGS: Mediastinum hilar structures normal the lungs are clear. Heart size
stable. No pleural effusion or pneumothorax. Thoracic spine
scoliosis.
IMPRESSION: No acute cardiopulmonary disease.

## 2021-02-20 ENCOUNTER — Encounter (HOSPITAL_BASED_OUTPATIENT_CLINIC_OR_DEPARTMENT_OTHER): Payer: Self-pay

## 2021-02-20 ENCOUNTER — Other Ambulatory Visit: Payer: Self-pay

## 2021-02-20 ENCOUNTER — Emergency Department (HOSPITAL_BASED_OUTPATIENT_CLINIC_OR_DEPARTMENT_OTHER): Payer: 59

## 2021-02-20 ENCOUNTER — Emergency Department (HOSPITAL_BASED_OUTPATIENT_CLINIC_OR_DEPARTMENT_OTHER)
Admission: EM | Admit: 2021-02-20 | Discharge: 2021-02-20 | Disposition: A | Discharge status: Home / Self Care | Payer: 59 | Attending: Emergency Medicine | Admitting: Emergency Medicine

## 2021-02-20 DIAGNOSIS — K2900 Acute gastritis without bleeding: Secondary | ICD-10-CM | POA: Diagnosis not present

## 2021-02-20 DIAGNOSIS — J45909 Unspecified asthma, uncomplicated: Secondary | ICD-10-CM | POA: Diagnosis not present

## 2021-02-20 DIAGNOSIS — R1012 Left upper quadrant pain: Secondary | ICD-10-CM

## 2021-02-20 LAB — PREGNANCY, URINE: Preg Test, Ur: NEGATIVE

## 2021-02-20 LAB — COMPREHENSIVE METABOLIC PANEL
ALT: 97 U/L — ABNORMAL HIGH (ref 0–44)
AST: 151 U/L — ABNORMAL HIGH (ref 15–41)
Albumin: 3.6 g/dL (ref 3.5–5.0)
Alkaline Phosphatase: 48 U/L (ref 38–126)
Anion gap: 9 (ref 5–15)
BUN: 8 mg/dL (ref 6–20)
CO2: 24 mmol/L (ref 22–32)
Calcium: 8.4 mg/dL — ABNORMAL LOW (ref 8.9–10.3)
Chloride: 104 mmol/L (ref 98–111)
Creatinine, Ser: 0.69 mg/dL (ref 0.44–1.00)
GFR, Estimated: >60 mL/min (ref >60)
Glucose, Bld: 100 mg/dL — ABNORMAL HIGH (ref 70–99)
Potassium: 3.2 mmol/L — ABNORMAL LOW (ref 3.5–5.1)
Sodium: 137 mmol/L (ref 135–145)
Total Bilirubin: 0.4 mg/dL (ref 0.3–1.2)
Total Protein: 6.8 g/dL (ref 6.5–8.1)

## 2021-02-20 LAB — CBC WITH DIFFERENTIAL/PLATELET
Abs Immature Granulocytes: 0.02 10*3/uL (ref 0.00–0.07)
Basophils Absolute: 0 10*3/uL (ref 0.0–0.1)
Basophils Relative: 0 %
Eosinophils Absolute: 0.1 10*3/uL (ref 0.0–0.5)
Eosinophils Relative: 1 %
HCT: 36.8 % (ref 36.0–46.0)
Hemoglobin: 11.8 g/dL — ABNORMAL LOW (ref 12.0–15.0)
Immature Granulocytes: 0 %
Lymphocytes Relative: 10 %
Lymphs Abs: 0.9 10*3/uL (ref 0.7–4.0)
MCH: 26.9 pg (ref 26.0–34.0)
MCHC: 32.1 g/dL (ref 30.0–36.0)
MCV: 84 fL (ref 80.0–100.0)
Monocytes Absolute: 0.3 10*3/uL (ref 0.1–1.0)
Monocytes Relative: 4 %
Neutro Abs: 7.2 10*3/uL (ref 1.7–7.7)
Neutrophils Relative %: 85 %
Platelets: 282 10*3/uL (ref 150–400)
RBC: 4.38 MIL/uL (ref 3.87–5.11)
RDW: 14.4 % (ref 11.5–15.5)
WBC: 8.5 10*3/uL (ref 4.0–10.5)
nRBC: 0 % (ref 0.0–0.2)

## 2021-02-20 LAB — URINALYSIS, ROUTINE W REFLEX MICROSCOPIC
Glucose, UA: NEGATIVE mg/dL
Ketones, ur: NEGATIVE mg/dL
Nitrite: NEGATIVE
Protein, ur: NEGATIVE mg/dL
Specific Gravity, Urine: 1.025 (ref 1.005–1.030)
pH: 5.5 (ref 5.0–8.0)

## 2021-02-20 LAB — URINALYSIS, MICROSCOPIC (REFLEX)

## 2021-02-20 LAB — LIPASE, BLOOD: Lipase: 28 U/L (ref 11–51)

## 2021-02-20 MED ORDER — ONDANSETRON HCL 4 MG/2ML IJ SOLN
4.0000 mg | Freq: Once | INTRAMUSCULAR | Status: AC
  Administered 2021-02-20: 4 mg via INTRAVENOUS
  Filled 2021-02-20: qty 2

## 2021-02-20 MED ORDER — ONDANSETRON 4 MG PO TBDP: 4.0000 mg | ORAL_TABLET | Freq: Three times a day (TID) | ORAL | 0 refills | Status: DC | PRN

## 2021-02-20 MED ORDER — SODIUM CHLORIDE 0.9 % IV BOLUS
1000.0000 mL | Freq: Once | INTRAVENOUS | Status: AC
  Administered 2021-02-20: 1000 mL via INTRAVENOUS

## 2021-02-20 MED ORDER — FAMOTIDINE 20 MG PO TABS: 20.0000 mg | ORAL_TABLET | Freq: Two times a day (BID) | ORAL | 0 refills | Status: AC

## 2021-02-20 MED ORDER — MORPHINE SULFATE (PF) 4 MG/ML IV SOLN
4.0000 mg | Freq: Once | INTRAVENOUS | Status: AC
  Administered 2021-02-20: 4 mg via INTRAVENOUS
  Filled 2021-02-20: qty 1

## 2021-02-20 NOTE — ED Notes (Signed)
ED Provider at bedside. 

## 2021-02-20 NOTE — ED Provider Notes (Signed)
[facility] EMERGENCY DEPARTMENT Provider Note   CSN: [id number] Arrival date & time: 02/20/21  1645     History Chief Complaint  Patient presents with  . Abdominal Pain    Dominique Tucker is a 25 y.o. female.  The history is provided by the patient and medical records.  Abdominal Pain  Dominique Tucker is a 25 y.o. female who presents to the Emergency Department complaining of pancreatitis. Two days ago she developed nausea, abdominal pain, emesis. She went to urgent care yesterday due to persistent abdominal pain, nausea and poor appetite. She had labs performed and was discharged home. Today she was contacted and told that her pancreatic enzyme was at thousand and she had pancreatitis and she needed evaluation. She has persistent abdominal pain, predominantly in the left upper quadrant. She has nausea and poor appetite but her vomiting has resolved. She has chills. No fever. She did have slight diarrhea on Monday, now resolved. No dysuria. She does drink occasional alcohol, last week and she had a few shots but did not overindulge. No prior similar symptoms.    Past Medical History:  Diagnosis Date  . Allergy   . Asthma   . Eczema   . Multiple allergies   . Multiple food allergies     There are no problems to display for this patient.   Past Surgical History:  Procedure Laterality Date  . WISDOM TOOTH EXTRACTION       OB History   No obstetric history on file.     Family History  Problem Relation Age of Onset  . Epilepsy Father   . Diabetes Maternal Grandmother   . Hypertension Maternal Grandmother   . Hyperlipidemia Maternal Grandmother   . Heart disease Maternal Grandmother   . Cancer Maternal Grandmother   . Diabetes Maternal Grandfather   . Hypertension Maternal Grandfather   . Hyperlipidemia Maternal Grandfather   . Heart disease Maternal Grandfather   . Alzheimer's disease Paternal Grandmother   . Diabetes Paternal Grandfather   .  Hypertension Paternal Grandfather     Social History   Tobacco Use  . Smoking status: Never Smoker  . Smokeless tobacco: Never Used  Vaping Use  . Vaping Use: Never used  Substance Use Topics  . Alcohol use: Yes    Comment: occ  . Drug use: No    Home Medications Prior to Admission medications   Medication Sig Start Date End Date Taking? Authorizing Provider  famotidine (PEPCID) 20 MG tablet Take 1 tablet (20 mg total) by mouth 2 (two) times daily. 02/20/21  Yes Tilden Fossa, MD  ondansetron (ZOFRAN ODT) 4 MG disintegrating tablet Take 1 tablet (4 mg total) by mouth every 8 (eight) hours as needed for nausea or vomiting. 02/20/21  Yes Tilden Fossa, MD  albuterol (PROVENTIL HFA;VENTOLIN HFA) 108 (90 BASE) MCG/ACT inhaler Inhale 2 puffs into the lungs 2 (two) times daily as needed for wheezing or shortness of breath (asthma). Wheezing or shortness of breath due to exertion    [provider]  albuterol (PROVENTIL) (2.5 MG/3ML) 0.083% nebulizer solution Take 3-6 mLs (2.5-5 mg total) by nebulization every 4 (four) hours as needed for wheezing or shortness of breath (asthma). Wheezing or shortness of breath due to exertion 08/27/17   Molpus, John, MD  beclomethasone (QVAR) 40 MCG/ACT inhaler Inhale 1 puff into the lungs daily as needed (congestion due to weather changes/asthma).    [provider]  dicyclomine (BENTYL) 20 MG tablet Take 1 tablet (  20 mg total) by mouth 2 (two) times daily. 08/28/17   Renne Crigler, PA-C  EPINEPHrine (EPIPEN JR) 0.15 MG/0.3ML injection Inject 0.15 mg into the muscle as needed for anaphylaxis.     [provider]  fexofenadine-pseudoephedrine (ALLEGRA-D) 60-120 MG per tablet Take 1 tablet by mouth 2 (two) times daily.    [provider]  fluticasone (FLONASE) 50 MCG/ACT nasal spray Place 1 spray into both nostrils daily as needed for allergies.  12/01/13   [provider]  ibuprofen (ADVIL,MOTRIN) 800 MG tablet  Take 800 mg by mouth every 8 (eight) hours as needed for moderate pain.    [provider]  montelukast (SINGULAIR) 10 MG tablet Take 10 mg by mouth 2 (two) times daily.    [provider]    Allergies    Other, Tobacco [tobacco], and Tree extract  Review of Systems   Review of Systems  Gastrointestinal: Positive for abdominal pain.  All other systems reviewed and are negative.   Physical Exam Updated Vital Signs BP (!) 127/91   Pulse 93   Temp 98.9 F (37.2 C) (Oral)   Resp 18   Ht 5' (1.524 m)   Wt 62.1 kg   LMP 02/16/2021   SpO2 100%   BMI 26.76 kg/m   Physical Exam Vitals and nursing note reviewed.  Constitutional:      Appearance: She is well-developed.  HENT:     Head: Normocephalic and atraumatic.  Cardiovascular:     Rate and Rhythm: Normal rate and regular rhythm.  Pulmonary:     Effort: Pulmonary effort is normal. No respiratory distress.  Abdominal:     Palpations: Abdomen is soft.     Tenderness: There is no guarding or rebound.     Comments: Moderate LUQ tenderness  Musculoskeletal:        General: No tenderness.  Skin:    General: Skin is warm and dry.  Neurological:     Mental Status: She is alert and oriented to person, place, and time.  Psychiatric:        Behavior: Behavior normal.     ED Results / Procedures / Treatments   Labs (all labs ordered are listed, but only abnormal results are displayed) Labs Reviewed  COMPREHENSIVE METABOLIC PANEL - Abnormal; Notable for the following components:      Result Value   Potassium 3.2 (*)    Glucose, Bld 100 (*)    Calcium 8.4 (*)    AST 151 (*)    ALT 97 (*)    All other components within normal limits  CBC WITH DIFFERENTIAL/PLATELET - Abnormal; Notable for the following components:   Hemoglobin 11.8 (*)    All other components within normal limits  URINALYSIS, ROUTINE W REFLEX MICROSCOPIC - Abnormal; Notable for the following components:   Hgb urine dipstick MODERATE (*)     Bilirubin Urine SMALL (*)    Leukocytes,Ua SMALL (*)    All other components within normal limits  URINALYSIS, MICROSCOPIC (REFLEX) - Abnormal; Notable for the following components:   Bacteria, UA FEW (*)    All other components within normal limits  LIPASE, BLOOD  PREGNANCY, URINE    EKG None  Radiology US Abdomen Complete  Result Date: 02/20/2021 CLINICAL DATA:  Pancreatitis EXAM: ABDOMEN ULTRASOUND COMPLETE COMPARISON:  None. FINDINGS: Gallbladder: No gallstones or wall thickening visualized. No sonographic Murphy sign noted by sonographer. Common bile duct: Diameter: Normal caliber, 3 mm Liver: No focal lesion identified. Within normal limits in  parenchymal echogenicity. Portal vein is patent on color Doppler imaging with normal direction of blood flow towards the liver. IVC: No abnormality visualized. Pancreas: Visualized portion unremarkable. Spleen: Size and appearance within normal limits. Right Kidney: Length: 10.4 cm. Echogenicity within normal limits. No mass or hydronephrosis visualized. Left Kidney: Length: 9.9 cm. Echogenicity within normal limits. No mass or hydronephrosis visualized. Abdominal aorta: No aneurysm visualized. Other findings: None. IMPRESSION: No acute findings. Electronically Signed   By: Charlett Nose M.D.   On: 02/20/2021 19:12    Procedures Procedures   Medications Ordered in ED Medications  sodium chloride 0.9 % bolus 1,000 mL (0 mLs Intravenous Stopped 02/20/21 1909)  morphine 4 MG/ML injection 4 mg (4 mg Intravenous Given 02/20/21 1736)  ondansetron (ZOFRAN) injection 4 mg (4 mg Intravenous Given 02/20/21 1736)    ED Course  I have reviewed the triage vital signs and the nursing notes.  Pertinent labs & imaging results that were available during my care of the patient were reviewed by me and considered in my medical decision making (see chart for details).    MDM Rules/Calculators/A&P                         patient here for evaluation of  abnormal lipase on outpatient labs performed yesterday. She does have left upper quadrant tenderness without peritoneal findings. Labs today with mild elevation in her transaminases. Normal lipase. Ultrasound is negative for acute process. She is able to tolerate liquids in the department. Presentation is not consistent with acute pancreatitis, perforated viscus, serious bacterial infection. Discussed home care for likely gastritis. Discussed PCP follow-up. Discussed return precautions.  Final Clinical Impression(s) / ED Diagnoses Final diagnoses:  Left upper quadrant abdominal pain  Acute gastritis without hemorrhage, unspecified gastritis type    Rx / DC Orders ED Discharge Orders         Ordered    ondansetron (ZOFRAN ODT) 4 MG disintegrating tablet  Every 8 hours PRN        02/20/21 1955    famotidine (PEPCID) 20 MG tablet  2 times daily        02/20/21 1955           Tilden Fossa, MD 02/20/21 2111

## 2021-02-20 NOTE — ED Notes (Signed)
Patient transported to Ultrasound 

## 2021-02-20 NOTE — Discharge Instructions (Addendum)
Your liver enzymes were a little high today. Avoid alcohol. Please see your family doctor to have these rechecked in the next week.

## 2021-02-20 NOTE — ED Triage Notes (Signed)
Pt c/o left side abd pain x 2 days-n/v/d-NAD-steady gait-seen at UC yesterday-was called today and advised she has pancreatitis-NAD-steady agit

## 2021-07-15 ENCOUNTER — Encounter (HOSPITAL_BASED_OUTPATIENT_CLINIC_OR_DEPARTMENT_OTHER): Payer: Self-pay

## 2021-07-15 ENCOUNTER — Emergency Department (HOSPITAL_BASED_OUTPATIENT_CLINIC_OR_DEPARTMENT_OTHER)
Admission: EM | Admit: 2021-07-15 | Discharge: 2021-07-16 | Disposition: A | Discharge status: Home / Self Care | Payer: 59 | Attending: Emergency Medicine | Admitting: Emergency Medicine

## 2021-07-15 ENCOUNTER — Other Ambulatory Visit: Payer: Self-pay

## 2021-07-15 DIAGNOSIS — Z7951 Long term (current) use of inhaled steroids: Secondary | ICD-10-CM | POA: Diagnosis not present

## 2021-07-15 DIAGNOSIS — W208XXA Other cause of strike by thrown, projected or falling object, initial encounter: Secondary | ICD-10-CM | POA: Insufficient documentation

## 2021-07-15 DIAGNOSIS — S91311A Laceration without foreign body, right foot, initial encounter: Secondary | ICD-10-CM | POA: Diagnosis not present

## 2021-07-15 DIAGNOSIS — Y990 Civilian activity done for income or pay: Secondary | ICD-10-CM | POA: Diagnosis not present

## 2021-07-15 DIAGNOSIS — J45909 Unspecified asthma, uncomplicated: Secondary | ICD-10-CM | POA: Diagnosis not present

## 2021-07-15 DIAGNOSIS — S99921A Unspecified injury of right foot, initial encounter: Secondary | ICD-10-CM | POA: Diagnosis present

## 2021-07-15 NOTE — ED Triage Notes (Signed)
Pt reports a metal piece of a press fell on right foot ~9pm-lac noted to top of foot-no bleeding-4x4/kling dsg applied-NAD-limping gait

## 2021-07-16 MED ORDER — CEPHALEXIN 250 MG PO CAPS
500.0000 mg | ORAL_CAPSULE | Freq: Once | ORAL | Status: AC
  Administered 2021-07-16: 500 mg via ORAL
  Filled 2021-07-16: qty 2

## 2021-07-16 MED ORDER — CEPHALEXIN 500 MG PO CAPS: 500.0000 mg | ORAL_CAPSULE | Freq: Two times a day (BID) | ORAL | 0 refills | Status: DC

## 2021-07-16 MED ORDER — LIDOCAINE HCL (PF) 1 % IJ SOLN
10.0000 mL | Freq: Once | INTRAMUSCULAR | Status: AC
  Administered 2021-07-16: 10 mL
  Filled 2021-07-16: qty 10

## 2021-07-16 NOTE — ED Provider Notes (Signed)
[facility] EMERGENCY DEPARTMENT Provider Note   CSN: [id number] Arrival date & time: 07/15/21  2121     History Chief Complaint  Patient presents with   Laceration    Dominique Tucker is a 25 y.o. female.  The history is provided by the patient.  Laceration She has history of asthma, allergies and comes in after suffering a laceration to the her right foot.  She was working on a car when a piece of metal fell landing on her foot causing laceration.  Last tetanus immunization was approximately 7 years ago.  She denies other injury.   Past Medical History:  Diagnosis Date   Allergy    Asthma    Eczema    Multiple allergies    Multiple food allergies     There are no problems to display for this patient.   Past Surgical History:  Procedure Laterality Date   WISDOM TOOTH EXTRACTION       OB History   No obstetric history on file.     Family History  Problem Relation Age of Onset   Epilepsy Father    Diabetes Maternal Grandmother    Hypertension Maternal Grandmother    Hyperlipidemia Maternal Grandmother    Heart disease Maternal Grandmother    Cancer Maternal Grandmother    Diabetes Maternal Grandfather    Hypertension Maternal Grandfather    Hyperlipidemia Maternal Grandfather    Heart disease Maternal Grandfather    Alzheimer's disease Paternal Grandmother    Diabetes Paternal Grandfather    Hypertension Paternal Grandfather     Social History   Tobacco Use   Smoking status: Never   Smokeless tobacco: Never  Vaping Use   Vaping Use: Never used  Substance Use Topics   Alcohol use: Not Currently   Drug use: No    Home Medications Prior to Admission medications   Medication Sig Start Date End Date Taking? Authorizing Provider  albuterol (PROVENTIL HFA;VENTOLIN HFA) 108 (90 BASE) MCG/ACT inhaler Inhale 2 puffs into the lungs 2 (two) times daily as needed for wheezing or shortness of breath (asthma). Wheezing or shortness of breath due  to exertion    [provider]  albuterol (PROVENTIL) (2.5 MG/3ML) 0.083% nebulizer solution Take 3-6 mLs (2.5-5 mg total) by nebulization every 4 (four) hours as needed for wheezing or shortness of breath (asthma). Wheezing or shortness of breath due to exertion 08/27/17   Molpus, John, MD  beclomethasone (QVAR) 40 MCG/ACT inhaler Inhale 1 puff into the lungs daily as needed (congestion due to weather changes/asthma).    [provider]  dicyclomine (BENTYL) 20 MG tablet Take 1 tablet (20 mg total) by mouth 2 (two) times daily. 08/28/17   Renne Crigler, PA-C  EPINEPHrine (EPIPEN JR) 0.15 MG/0.3ML injection Inject 0.15 mg into the muscle as needed for anaphylaxis.     [provider]  famotidine (PEPCID) 20 MG tablet Take 1 tablet (20 mg total) by mouth 2 (two) times daily. 02/20/21   Tilden Fossa, MD  fexofenadine-pseudoephedrine (ALLEGRA-D) 60-120 MG per tablet Take 1 tablet by mouth 2 (two) times daily.    [provider]  fluticasone (FLONASE) 50 MCG/ACT nasal spray Place 1 spray into both nostrils daily as needed for allergies.  12/01/13   [provider]  ibuprofen (ADVIL,MOTRIN) 800 MG tablet Take 800 mg by mouth every 8 (eight) hours as needed for moderate pain.    [provider]  montelukast (SINGULAIR) 10 MG tablet Take 10 mg by mouth  2 (two) times daily.    [provider]  ondansetron (ZOFRAN ODT) 4 MG disintegrating tablet Take 1 tablet (4 mg total) by mouth every 8 (eight) hours as needed for nausea or vomiting. 02/20/21   Tilden Fossa, MD    Allergies    Other, Tobacco [tobacco], and Tree extract  Review of Systems   Review of Systems  All other systems reviewed and are negative.  Physical Exam Updated Vital Signs BP 115/78   Pulse 86   Temp 98.2 F (36.8 C) (Oral)   Resp 17   Ht 5' (1.524 m)   Wt 64 kg   LMP 06/23/2021   SpO2 100%   BMI 27.54 kg/m   Physical Exam Vitals and nursing note reviewed.   25 year old female, resting comfortably and in no acute distress. Vital signs are normal. Oxygen saturation is 100%, which is normal. Head is normocephalic and atraumatic. PERRLA, EOMI. Oropharynx is clear. Neck is nontender and supple without adenopathy Lungs are clear without rales, wheezes, or rhonchi. Chest is nontender. Heart has regular rate and rhythm without murmur. Abdomen is soft, flat, nontender. Extremities: Laceration is present on the dorsum of the right foot.  Distal neurovascular exam is intact. Skin is warm and dry without rash. Neurologic: Mental status is normal, cranial nerves are intact, there are no motor or sensory deficits.  ED Results / Procedures / Treatments    Procedures .Marland KitchenLaceration Repair  Date/Time: 07/16/2021 1:16 AM Performed by: Dione Booze, MD Authorized by: Dione Booze, MD   Consent:    Consent obtained:  Verbal   Consent given by:  Patient   Risks discussed:  Infection, pain and poor cosmetic result   Alternatives discussed:  No treatment Universal protocol:    Procedure explained and questions answered to patient or proxy's satisfaction: yes     Relevant documents present and verified: yes     Required blood products, implants, devices, and special equipment available: yes     Site/side marked: yes     Immediately prior to procedure, a time out was called: yes     Patient identity confirmed:  Verbally with patient and arm band Anesthesia:    Anesthesia method:  Local infiltration   Local anesthetic:  Lidocaine 1% w/o epi Laceration details:    Location:  Foot   Foot location:  Top of R foot   Length (cm):  4   Depth (mm):  5 Pre-procedure details:    Preparation:  Patient was prepped and draped in usual sterile fashion Exploration:    Limited defect created (wound extended): no     Hemostasis achieved with:  Direct pressure   Wound exploration: entire depth of wound visualized     Wound extent: no foreign bodies/material noted and  no tendon damage noted     Wound extent comment:  Extensor tendon was visible without any injury   Contaminated: no   Treatment:    Area cleansed with:  Saline   Amount of cleaning:  Standard   Debridement:  None   Undermining:  None   Scar revision: no   Skin repair:    Repair method:  Sutures   Suture size:  4-0   Suture material:  Prolene   Suture technique:  Simple interrupted   Number of sutures:  8 Repair type:    Repair type:  Simple Post-procedure details:    Dressing:  Antibiotic ointment and sterile dressing   Procedure completion:  Tolerated well, no immediate complications  Medications Ordered in ED Medications  cephALEXin (KEFLEX) capsule 500 mg (has no administration in time range)  lidocaine (PF) (XYLOCAINE) 1 % injection 10 mL (10 mLs Infiltration Given by Other 07/16/21 0104)    ED Course  I have reviewed the triage vital signs and the nursing notes.  MDM Rules/Calculators/A&P                         Laceration of dorsum of right foot.  Old records are reviewed, and she has no relevant past visits.  Laceration was closed with sutures.  Laceration did not extend down to the extensor tendon without any tendon injury.  Because of exposure to tendon, decision was made to put her on a short course of prophylactic antibiotics.  She is discharged with a prescription for cephalexin.  Suture removal in 10 days.  Final Clinical Impression(s) / ED Diagnoses Final diagnoses:  Laceration of right foot, initial encounter    Rx / DC Orders ED Discharge Orders          Ordered    cephALEXin (KEFLEX) 500 MG capsule  2 times daily        07/16/21 0115             Dione Booze, MD 07/16/21 [phone]

## 2021-07-16 NOTE — Discharge Instructions (Addendum)
Watch for signs of infection.  If any develop, return to the emergency department or see your primary care provider.

## 2021-09-12 IMAGING — US US ABDOMEN COMPLETE
1 series · 14 of 25 positions shown · non-contrast
Comparison: None.

CLINICAL DATA: Pancreatitis

EXAM:
ABDOMEN ULTRASOUND COMPLETE

[Series 1: us abdomen complete · 14 of 75 slices shown]
[im 1/75]
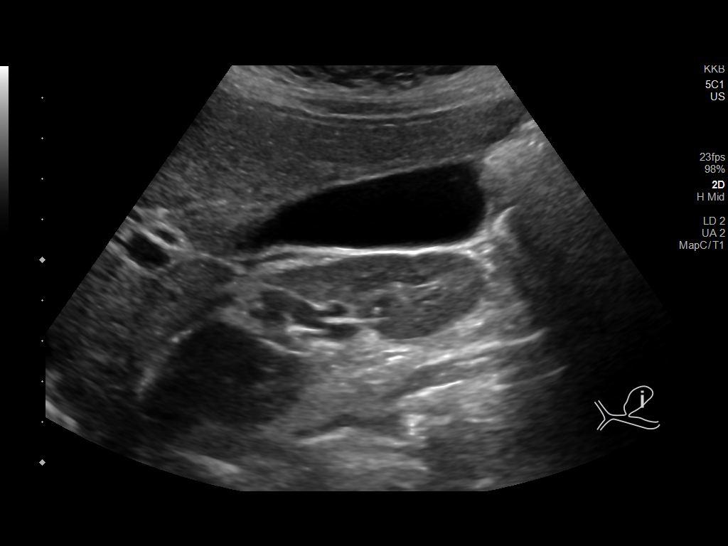
[im 7/75]
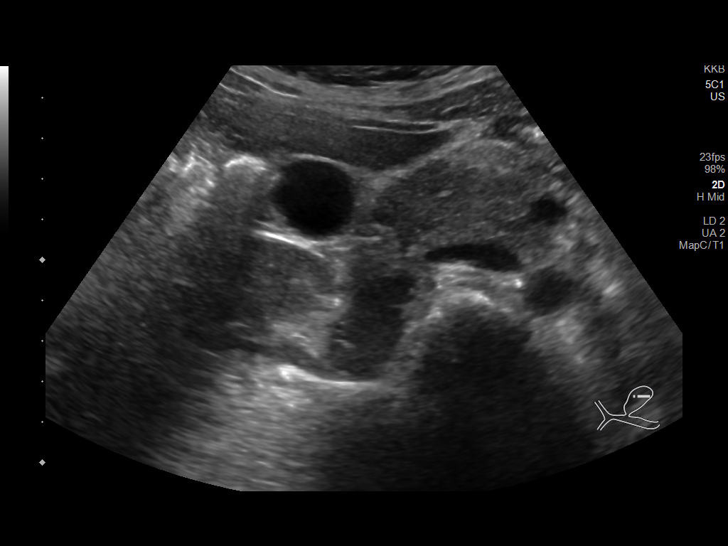
[im 13/75]
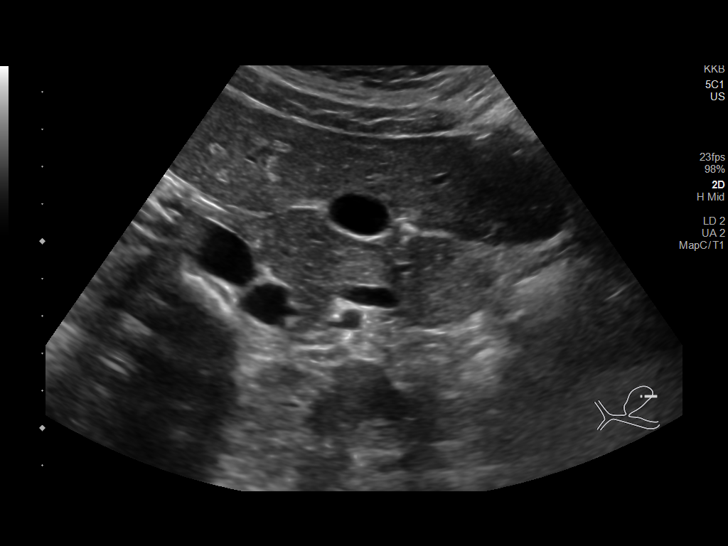
[im 19/75]
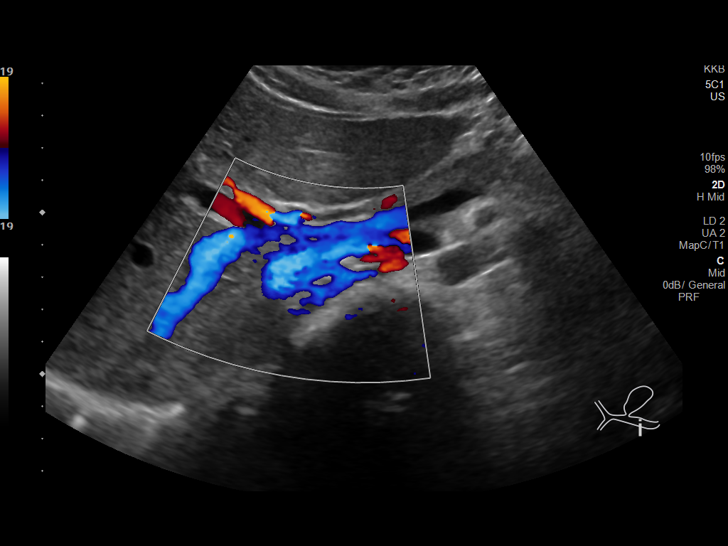
[im 25/75]
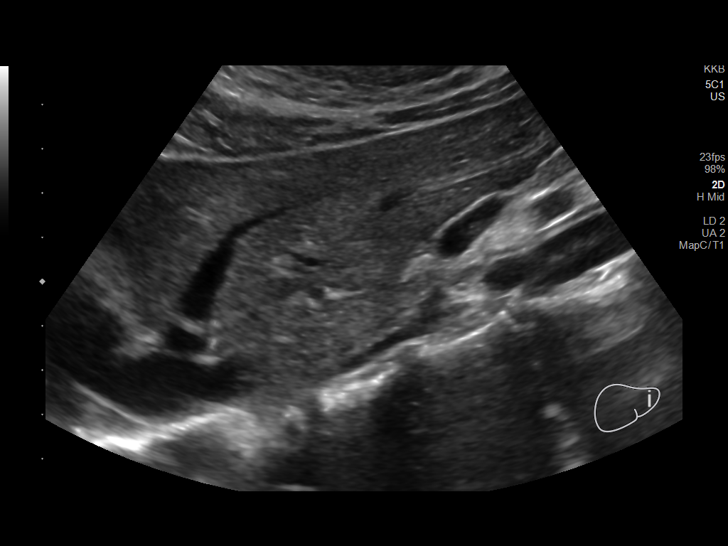
[im 28/75]
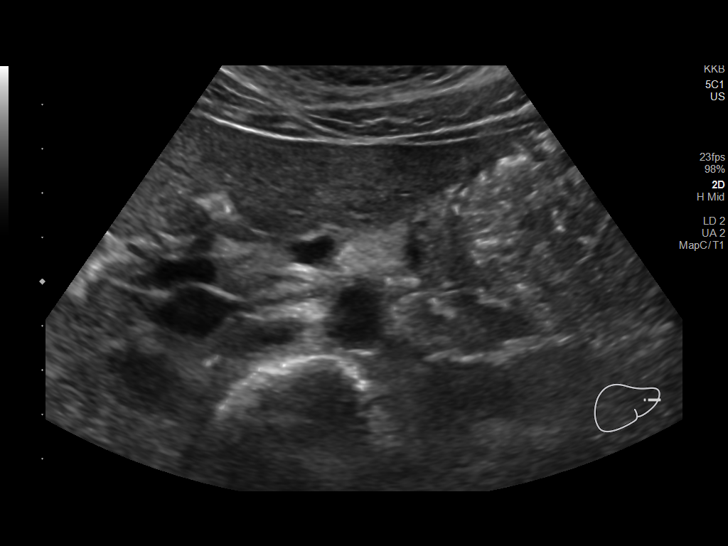
[im 34/75]
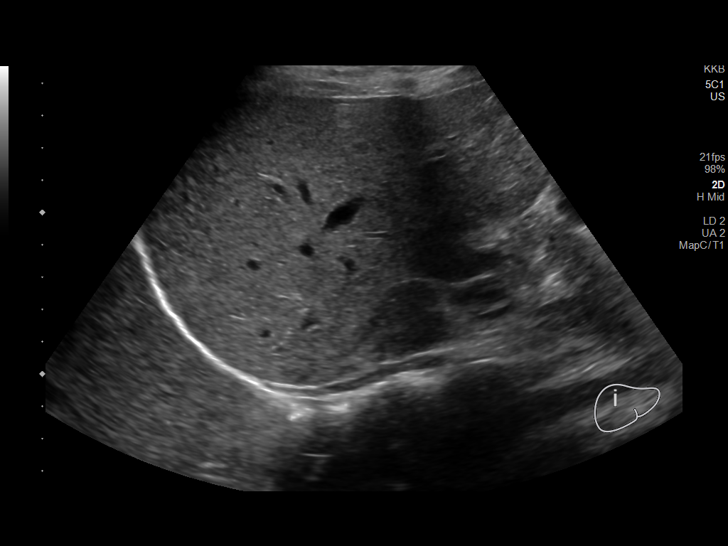
[im 41/75]
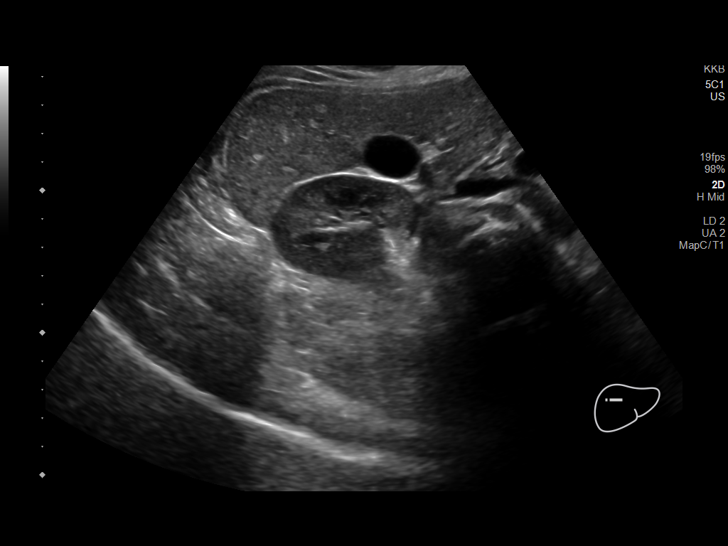
[im 47/75]
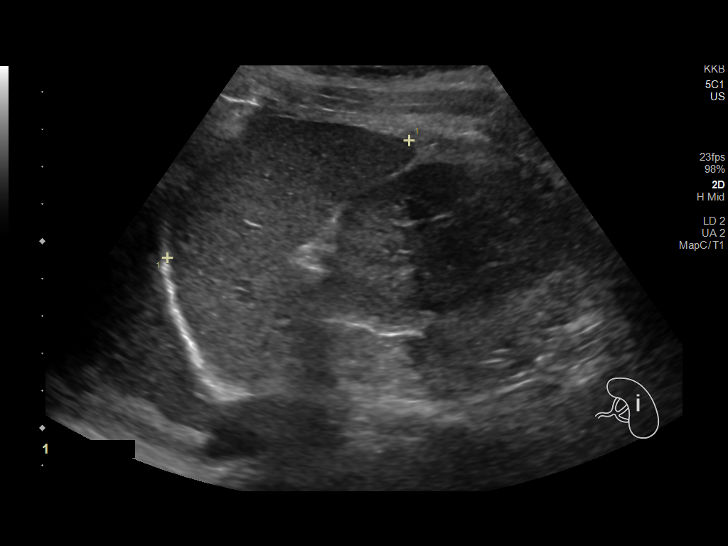
[im 50/75]
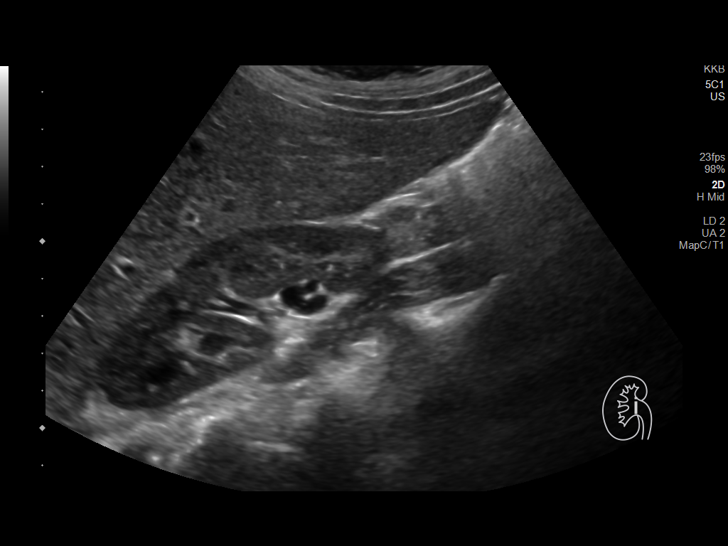
[im 56/75]
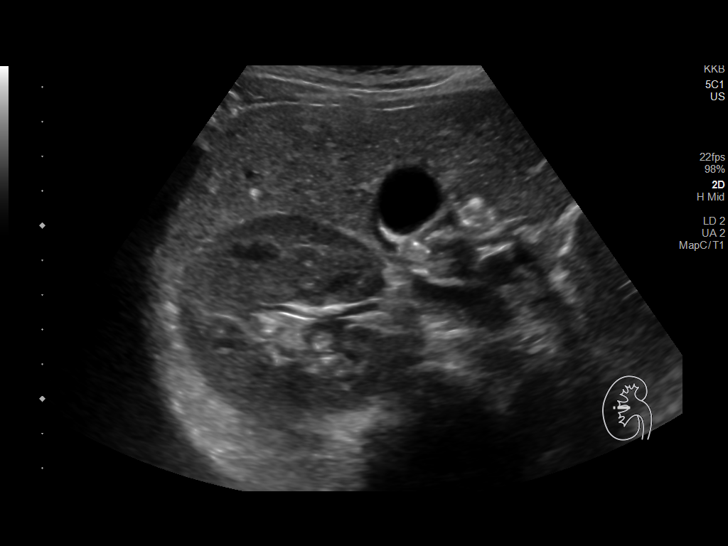
[im 62/75]
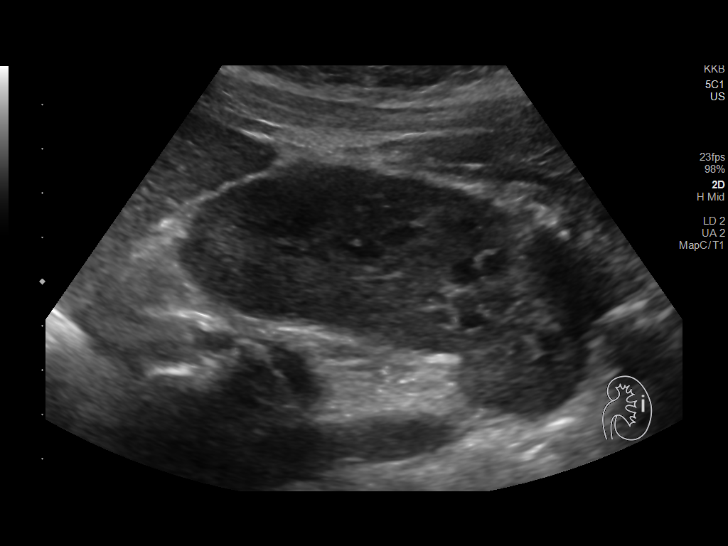
[im 68/75]
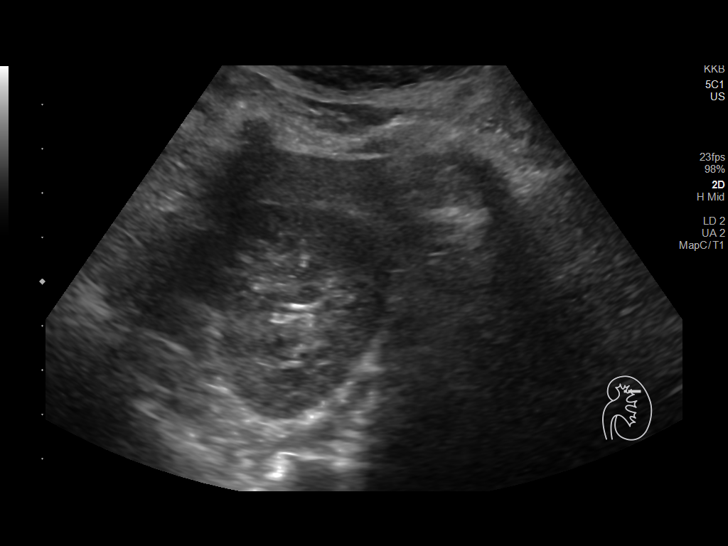
[im 75/75]
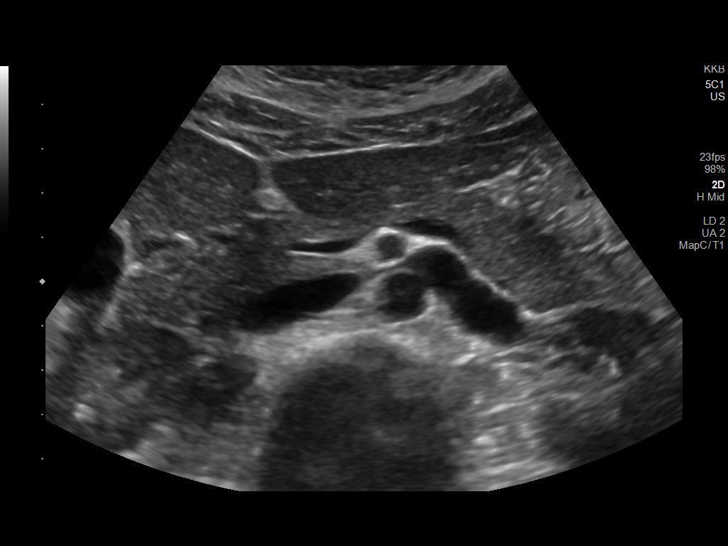

[14 of 25 positions shown; findings below may reference images not displayed]

FINDINGS: Gallbladder: No gallstones or wall thickening visualized. No
sonographic Murphy sign noted by sonographer.

Common bile duct: Diameter: Normal caliber, 3 mm

Liver: No focal lesion identified. Within normal limits in
parenchymal echogenicity. Portal vein is patent on color Doppler
imaging with normal direction of blood flow towards the liver.

IVC: No abnormality visualized.

Pancreas: Visualized portion unremarkable.

Spleen: Size and appearance within normal limits.

Right Kidney: Length: 10.4 cm. Echogenicity within normal limits. No
mass or hydronephrosis visualized.

Left Kidney: Length: 9.9 cm. Echogenicity within normal limits. No
mass or hydronephrosis visualized.

Abdominal aorta: No aneurysm visualized.

Other findings: None.
IMPRESSION: No acute findings.

## 2022-04-06 ENCOUNTER — Ambulatory Visit
Admission: EM | Admit: 2022-04-06 | Discharge: 2022-04-06 | Disposition: A | Discharge status: Home / Self Care | Payer: Commercial Managed Care - PPO | Attending: Emergency Medicine | Admitting: Emergency Medicine

## 2022-04-06 DIAGNOSIS — N61 Mastitis without abscess: Secondary | ICD-10-CM

## 2022-04-06 MED ORDER — CEPHALEXIN 500 MG PO CAPS: 500.0000 mg | ORAL_CAPSULE | Freq: Four times a day (QID) | ORAL | 0 refills | Status: AC

## 2022-04-06 MED ORDER — IBUPROFEN 800 MG PO TABS
800.0000 mg | ORAL_TABLET | Freq: Once | ORAL | Status: AC
  Administered 2022-04-06: 800 mg via ORAL

## 2022-04-06 NOTE — ED Provider Notes (Signed)
[facility]    CSN: [id number] Arrival date & time: 04/06/22  0910    HISTORY   Chief Complaint  Patient presents with   Breast Problem   HPI Dominique Tucker is a 26 y.o. female. Patient presents to urgent care stating that she removed a right-sided nipple ring 1-1 1/2 week ago. Pt states she has since had itching and swelling at the insertion site of her nipple ring.  Patient states she was concerned about getting a keloid but also noticed that the area is red.  Patient states there is not any drainage from the inflamed tissue however notes that it is tender to touch.  Patient denies breast mass otherwise.  Patient denies concerns with left nipple.    The history is provided by the patient.  Past Medical History:  Diagnosis Date   Allergy    Asthma    Eczema    Multiple allergies    Multiple food allergies    There are no problems to display for this patient.  Past Surgical History:  Procedure Laterality Date   WISDOM TOOTH EXTRACTION     OB History     Gravida  0   Para  0   Term  0   Preterm  0   AB  0   Living  0      SAB  0   IAB  0   Ectopic  0   Multiple  0   Live Births  0          Home Medications    Prior to Admission medications   Medication Sig Start Date End Date Taking? Authorizing Provider  fexofenadine-pseudoephedrine (ALLEGRA-D) 60-120 MG per tablet Take 1 tablet by mouth 2 (two) times daily.   Yes [provider]  albuterol (PROVENTIL HFA;VENTOLIN HFA) 108 (90 BASE) MCG/ACT inhaler Inhale 2 puffs into the lungs 2 (two) times daily as needed for wheezing or shortness of breath (asthma). Wheezing or shortness of breath due to exertion    [provider]  albuterol (PROVENTIL) (2.5 MG/3ML) 0.083% nebulizer solution Take 3-6 mLs (2.5-5 mg total) by nebulization every 4 (four) hours as needed for wheezing or shortness of breath (asthma). Wheezing or shortness of breath due to exertion 08/27/17   Molpus,  John, MD  beclomethasone (QVAR) 40 MCG/ACT inhaler Inhale 1 puff into the lungs daily as needed (congestion due to weather changes/asthma).    [provider]  cephALEXin (KEFLEX) 500 MG capsule Take 1 capsule (500 mg total) by mouth 2 (two) times daily. 0000000   Delora Fuel, MD  dicyclomine (BENTYL) 20 MG tablet Take 1 tablet (20 mg total) by mouth 2 (two) times daily. 08/28/17   Carlisle Cater, PA-C  EPINEPHrine (EPIPEN JR) 0.15 MG/0.3ML injection Inject 0.15 mg into the muscle as needed for anaphylaxis.     [provider]  famotidine (PEPCID) 20 MG tablet Take 1 tablet (20 mg total) by mouth 2 (two) times daily. 02/20/21   Quintella Reichert, MD  fluticasone [facility]) 50 MCG/ACT nasal spray Place 1 spray into both nostrils daily as needed for allergies.  12/01/13   [provider]  ibuprofen (ADVIL,MOTRIN) 800 MG tablet Take 800 mg by mouth every 8 (eight) hours as needed for moderate pain.    [provider]  montelukast (SINGULAIR) 10 MG tablet Take 10 mg by mouth 2 (two) times daily.    [provider]  ondansetron (ZOFRAN ODT) 4 MG disintegrating tablet Take 1  tablet (4 mg total) by mouth every 8 (eight) hours as needed for nausea or vomiting. 02/20/21   Quintella Reichert, MD    Family History Family History  Problem Relation Age of Onset   Epilepsy Father    Diabetes Maternal Grandmother    Hypertension Maternal Grandmother    Hyperlipidemia Maternal Grandmother    Heart disease Maternal Grandmother    Cancer Maternal Grandmother    Diabetes Maternal Grandfather    Hypertension Maternal Grandfather    Hyperlipidemia Maternal Grandfather    Heart disease Maternal Grandfather    Alzheimer's disease Paternal Grandmother    Diabetes Paternal Grandfather    Hypertension Paternal Grandfather    Social History Social History   Tobacco Use   Smoking status: Never   Smokeless tobacco: Never  Vaping Use   Vaping Use: Never used  Substance Use  Topics   Alcohol use: Not Currently   Drug use: No   Allergies   Other, Tobacco [tobacco], and Tree extract  Review of Systems Review of Systems Pertinent findings noted in history of present illness.   Physical Exam Triage Vital Signs ED Triage Vitals  Enc Vitals Group     BP 08/30/21 0827 (!) 147/82     Pulse Rate 08/30/21 0827 72     Resp 08/30/21 0827 18     Temp 08/30/21 0827 98.3 F (36.8 C)     Temp Source 08/30/21 0827 Oral     SpO2 08/30/21 0827 98 %     Weight --      Height --      Head Circumference --      Peak Flow --      Pain Score 08/30/21 0826 5     Pain Loc --      Pain Edu? --      Excl. in [place]? --   No data found.  Updated Vital Signs BP 122/84   Pulse 83   Temp 99 F (37.2 C) (Oral)   Resp 16   LMP 03/20/2022   SpO2 99%   Physical Exam Exam conducted with a chaperone present.  Chest:  Breasts:    Tanner Score is 5.     Breasts are symmetrical.     Right: Swelling (Medial aspect of right nipple with 0.75 mm inflamed lesion that is nonfluctuant, tender to palpation.) present. No bleeding, inverted nipple, mass, nipple discharge, skin change or tenderness.     Left: Normal.  Lymphadenopathy:     Upper Body:     Right upper body: No supraclavicular, axillary or pectoral adenopathy.     Left upper body: No supraclavicular, axillary or pectoral adenopathy.    Visual Acuity Right Eye Distance:   Left Eye Distance:   Bilateral Distance:    Right Eye Near:   Left Eye Near:    Bilateral Near:     UC Couse / Diagnostics / Procedures:    EKG  Radiology No results found.  Procedures Procedures (including critical care time)  UC Diagnoses / Final Clinical Impressions(s)   I have reviewed the triage vital signs and the nursing notes.  Pertinent labs & imaging results that were available during my care of the patient were reviewed by me and considered in my medical decision making (see chart for details).    Final diagnoses:  Nipple  infection in female   Patient advised because there is no palpable mass beneath the areola and no purulent drainage at this time, recommend that she begin Keflex  for 7 days follow-up as needed if swelling and tenderness does not improve.  ED Prescriptions     Medication Sig Dispense Auth. Provider   cephALEXin (KEFLEX) 500 MG capsule Take 1 capsule (500 mg total) by mouth 4 (four) times daily for 10 days. 40 capsule Lynden Oxford Scales, PA-C      PDMP not reviewed this encounter.  Pending results:  Labs Reviewed - No data to display  Medications Ordered in UC: Medications  ibuprofen (ADVIL) tablet 800 mg (800 mg Oral Given 04/06/22 1107)    Disposition Upon Discharge:  Condition: stable for discharge home Home: take medications as prescribed; routine discharge instructions as discussed; follow up as advised.  Patient presented with an acute illness with associated systemic symptoms and significant discomfort requiring urgent management. In my opinion, this is a condition that a prudent lay person (someone who possesses an average knowledge of health and medicine) may potentially expect to result in complications if not addressed urgently such as respiratory distress, impairment of bodily function or dysfunction of bodily organs.   Routine symptom specific, illness specific and/or disease specific instructions were discussed with the patient and/or caregiver at length.   As such, the patient has been evaluated and assessed, work-up was performed and treatment was provided in alignment with urgent care protocols and evidence based medicine.  Patient/parent/caregiver has been advised that the patient may require follow up for further testing and treatment if the symptoms continue in spite of treatment, as clinically indicated and appropriate.  Patient/parent/caregiver has been advised to return to the [facility] or PCP if no better; to PCP or the Emergency Department if new signs and symptoms  develop, or if the current signs or symptoms continue to change or worsen for further workup, evaluation and treatment as clinically indicated and appropriate  The patient will follow up with their current PCP if and as advised. If the patient does not currently have a PCP we will assist them in obtaining one.   The patient may need specialty follow up if the symptoms continue, in spite of conservative treatment and management, for further workup, evaluation, consultation and treatment as clinically indicated and appropriate.   Patient/parent/caregiver verbalized understanding and agreement of plan as discussed.  All questions were addressed during visit.  Please see discharge instructions below for further details of plan.  Discharge Instructions:   Discharge Instructions      Please begin Keflex 500 mg, 1 capsule 4 times daily for the next 10 days.  Please monitor the site for increasing redness, tenderness or new onset of a mass deeper within your breast beneath the lesion on your nipple.  If any of these occur despite antibiotic therapy, please report to the emergency room for further evaluation.  Thank you for visiting urgent care today.  I appreciate your waiting and also appreciate the opportunity to participate your care.      This office note has been dictated using Museum/gallery curator.  Unfortunately, and despite my best efforts, this method of dictation can sometimes lead to occasional typographical or grammatical errors.  I apologize in advance if this occurs.     Lynden Oxford Scales, PA-C 04/06/22 1909

## 2022-04-06 NOTE — Discharge Instructions (Addendum)
Please begin Keflex 500 mg, 1 capsule 4 times daily for the next 10 days.  Please monitor the site for increasing redness, tenderness or new onset of a mass deeper within your breast beneath the lesion on your nipple.  If any of these occur despite antibiotic therapy, please report to the emergency room for further evaluation.  Thank you for visiting urgent care today.  I appreciate your waiting and also appreciate the opportunity to participate your care.

## 2022-04-06 NOTE — ED Triage Notes (Signed)
Removed nipple ring 1-1 1/2 week ago. Pt states she has itching and swollen tissue to the rt nipple. Pt was concerned that she is getting a keloid. Denies drainage.
